# Patient Record
Sex: Male | Born: 1960 | Race: White | Hispanic: No | Marital: Married | Smoking: Current every day smoker
Health system: Southern US, Community
[De-identification: ages and names within clinical notes are randomized; demographics above are authoritative.]

## PROBLEM LIST (undated history)

## (undated) DIAGNOSIS — I2510 Atherosclerotic heart disease of native coronary artery without angina pectoris: Secondary | ICD-10-CM

## (undated) DIAGNOSIS — E785 Hyperlipidemia, unspecified: Secondary | ICD-10-CM

## (undated) DIAGNOSIS — I10 Essential (primary) hypertension: Secondary | ICD-10-CM

## (undated) DIAGNOSIS — Z720 Tobacco use: Secondary | ICD-10-CM

## (undated) DIAGNOSIS — G43909 Migraine, unspecified, not intractable, without status migrainosus: Secondary | ICD-10-CM

## (undated) HISTORY — DX: Atherosclerotic heart disease of native coronary artery without angina pectoris: I25.10

## (undated) HISTORY — PX: TONSILLECTOMY: SHX5217

## (undated) HISTORY — DX: Tobacco use: Z72.0

## (undated) HISTORY — DX: Migraine, unspecified, not intractable, without status migrainosus: G43.909

## (undated) HISTORY — DX: Essential (primary) hypertension: I10

## (undated) HISTORY — PX: DENTAL RESTORATION/EXTRACTION WITH X-RAY: SHX5796

## (undated) HISTORY — DX: Hyperlipidemia, unspecified: E78.5

## (undated) HISTORY — PX: WISDOM TOOTH EXTRACTION: SHX21

---

## 2012-12-20 HISTORY — PX: CORONARY ANGIOPLASTY WITH STENT PLACEMENT: SHX49

## 2012-12-25 ENCOUNTER — Ambulatory Visit: Payer: Self-pay | Admitting: Internal Medicine

## 2012-12-26 LAB — BASIC METABOLIC PANEL
Anion Gap: 12 (ref 7–16)
BUN: 11 mg/dL (ref 7–18)
Calcium, Total: 8.2 mg/dL — ABNORMAL LOW (ref 8.5–10.1)
Chloride: 108 mmol/L — ABNORMAL HIGH (ref 98–107)
Co2: 22 mmol/L (ref 21–32)
EGFR (African American): >60
EGFR (Non-African Amer.): >60
Glucose: 76 mg/dL (ref 65–99)
Potassium: 3.8 mmol/L (ref 3.5–5.1)

## 2012-12-26 LAB — CK-MB: CK-MB: <0.5 ng/mL — ABNORMAL LOW (ref 0.5–3.6)

## 2013-06-18 ENCOUNTER — Emergency Department: Payer: Self-pay | Admitting: Emergency Medicine

## 2013-06-18 LAB — CBC
HCT: 46.5 % (ref 40.0–52.0)
MCH: 33.1 pg (ref 26.0–34.0)
MCHC: 34.5 g/dL (ref 32.0–36.0)
MCV: 96 fL (ref 80–100)
RBC: 4.86 10*6/uL (ref 4.40–5.90)
RDW: 12.4 % (ref 11.5–14.5)

## 2013-06-18 LAB — URINALYSIS, COMPLETE
Bacteria: NONE SEEN
Bilirubin,UR: NEGATIVE
Blood: NEGATIVE
Ketone: NEGATIVE
Nitrite: NEGATIVE
Specific Gravity: 1.014 (ref 1.003–1.030)
WBC UR: NONE SEEN /HPF (ref 0–5)

## 2013-06-18 LAB — COMPREHENSIVE METABOLIC PANEL
Albumin: 4.1 g/dL (ref 3.4–5.0)
Alkaline Phosphatase: 79 U/L (ref 50–136)
Anion Gap: 5 — ABNORMAL LOW (ref 7–16)
BUN: 15 mg/dL (ref 7–18)
Calcium, Total: 9.1 mg/dL (ref 8.5–10.1)
Co2: 28 mmol/L (ref 21–32)
EGFR (Non-African Amer.): >60
Glucose: 96 mg/dL (ref 65–99)
Osmolality: 282 (ref 275–301)
Potassium: 4.2 mmol/L (ref 3.5–5.1)
SGOT(AST): 20 U/L (ref 15–37)
Sodium: 141 mmol/L (ref 136–145)

## 2015-04-11 NOTE — Discharge Summary (Signed)
PATIENT NAME:  Brad Cole, Brad Cole MR#:  [id number] DATE OF BIRTH:  06/22/1961  DATE OF ADMISSION:  12/25/2012 DATE OF DISCHARGE:  12/26/2012  PRIMARY CARE PHYSICIAN: Golden Pop, MD  DISCHARGE DIAGNOSES: 1. Unstable angina.  2. Hyperlipidemia and abnormal EKG.  3. Coronary artery disease.  HISTORY OF PRESENT ILLNESS AND HOSPITAL COURSE: This is a 54 year old male with heavy smoking and borderline hyperlipidemia who has had progressive episodes of chest discomfort and shortness of breath with an abnormal EKG showing T-wave inversions in the anterior precordium. The patient underwent cardiac catheterization showing moderate atherosclerosis of the circumflex and right coronary artery with a critical 100% stenosis of the left anterior descending artery. The patient underwent a PCI and drug-eluting stent placement of the left anterior descending artery without complication. The patient was ambulating well without further issue and had reached his maximal hospital benefit.   DISCHARGE MEDICATIONS: Plavix 75 mg each day, aspirin 325 mg each day, metoprolol 25 mg twice per day and Lipitor 40 mg each day.   FOLLOWUP: The patient is to follow up with Dr. Nehemiah Massed in two weeks for further adjustments of medications.     ____________________________ Corey Skains, MD bjk:es D: 12/26/2012 07:48:46 ET T: 12/26/2012 12:45:23 ET JOB#: [id number]  cc: Corey Skains, MD, <Dictator> Corey Skains MD ELECTRONICALLY SIGNED 01/04/2013 8:26

## 2015-07-09 DIAGNOSIS — E785 Hyperlipidemia, unspecified: Secondary | ICD-10-CM | POA: Insufficient documentation

## 2015-07-09 DIAGNOSIS — G43909 Migraine, unspecified, not intractable, without status migrainosus: Secondary | ICD-10-CM | POA: Insufficient documentation

## 2015-07-11 ENCOUNTER — Ambulatory Visit (INDEPENDENT_AMBULATORY_CARE_PROVIDER_SITE_OTHER): Payer: BLUE CROSS/BLUE SHIELD | Admitting: Family Medicine

## 2015-07-11 ENCOUNTER — Telehealth: Payer: Self-pay

## 2015-07-11 ENCOUNTER — Encounter: Payer: Self-pay | Admitting: Family Medicine

## 2015-07-11 VITALS — BP 153/98 | HR 85 | Temp 97.9°F | Ht 67.5 in | Wt 168.3 lb

## 2015-07-11 DIAGNOSIS — E785 Hyperlipidemia, unspecified: Secondary | ICD-10-CM

## 2015-07-11 DIAGNOSIS — R251 Tremor, unspecified: Secondary | ICD-10-CM

## 2015-07-11 DIAGNOSIS — I2510 Atherosclerotic heart disease of native coronary artery without angina pectoris: Secondary | ICD-10-CM

## 2015-07-11 DIAGNOSIS — Z720 Tobacco use: Secondary | ICD-10-CM | POA: Diagnosis not present

## 2015-07-11 DIAGNOSIS — R278 Other lack of coordination: Secondary | ICD-10-CM

## 2015-07-11 DIAGNOSIS — G43009 Migraine without aura, not intractable, without status migrainosus: Secondary | ICD-10-CM

## 2015-07-11 MED ORDER — PROPRANOLOL HCL 10 MG PO TABS: 10.0000 mg | ORAL_TABLET | Freq: Three times a day (TID) | ORAL | Status: DC

## 2015-07-11 NOTE — Assessment & Plan Note (Signed)
-   Patient is not ready to quit 

## 2015-07-11 NOTE — Patient Instructions (Addendum)
I've sent a prescription for propranolol 10 mg to [facility] and the new instructions are for one pill three times a day (every eight hours) You had been taking it just twice a day If you find that three times a day makes you sluggish or tired or run down, then decrease to twice a day and schedule a visit We'll contact you about the referral appointment to see the neurologist I do recommend that you quit smoking; if and when that day comes that you decide you are ready to quit, I am here to help if needed I do recommend that you don't drive if you have any trouble with shaking or coordination to the point that it interferes with your ability to operate machinery Return in 2 weeks

## 2015-07-11 NOTE — Assessment & Plan Note (Signed)
Refer to neurology with abnormal neurologic findings; order brain MRI; do NOT drive if any problems operating motor vehicle

## 2015-07-11 NOTE — Assessment & Plan Note (Addendum)
Sees Dr. Nehemiah Massed, but not checked in a while; check lipids today; patient not interested in statins; there is room for improvement in his diet, so encouraged no more than 3 eggs per week, and limit bacon, sausage, pepperoni, cheeseburgers, etc; more fruits and veggies and whole grains

## 2015-07-11 NOTE — Assessment & Plan Note (Signed)
Sees Dr. Nehemiah Massed

## 2015-07-11 NOTE — Addendum Note (Signed)
Addended by: Josilyn Shippee, Satira Anis on: 07/11/2015 12:15 PM   Modules accepted: Orders, SmartSet

## 2015-07-11 NOTE — Assessment & Plan Note (Signed)
Much improved with beta-blocker

## 2015-07-11 NOTE — Telephone Encounter (Signed)
Called to notify patient that he is scheduled for the following: MRI brain w/o contrast [facility] 07/15/15 Arrive at 2:30 for a 3:00 appointment.

## 2015-07-11 NOTE — Progress Notes (Signed)
BP 153/98 mmHg  Pulse 85  Temp(Src) 97.9 F (36.6 C)  Ht 5' 7.5" (1.715 m)  Wt 168 lb 4.8 oz (76.34 kg)  BMI 25.96 kg/m2  SpO2 99%   Subjective:    Patient ID: Brad Cole, male    DOB: Jan 22, 1961, 54 y.o.   MRN: [id number]  HPI: Brad Cole is a 54 y.o. male  Chief Complaint  Patient presents with  . Hypertension    pt is out of his propanolol, he now has to to have this in a 90 day supply through [facility].  . Tremors    patient complains of tremors for several months, states that it has gotten progressively worse. He does not notice it his family members notice it.    He needs refills of beta-blocker, took last one yesterday; helps limit headaches tremendously He has been having twitches and tremors; started several months ago; every one else noticing; happens behind the pulpit; sometimes when driving, it occurs; doesn't do it all the time; laying on the couch, can happen then too; started in the right hand several month ago; his father has a nervous shake, not sure how old; he is still alive, 52 years old Does not feel nervous around people or nervous on the inside No change in his voice, no shakiness in his voice He does stutter sometimes, not bad; that has been going on for a while He has lost 2-3 pounds but that's just summertime loss he says He usually does not sleep very well; 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 Interim medical history since our last visit reviewed. Allergies and medications reviewed and updated.  Review of Systems  Per HPI unless specifically indicated above      Objective:    BP 153/98 mmHg  Pulse 85  Temp(Src) 97.9 F (36.6 C)  Ht 5' 7.5" (1.715 m)  Wt 168 lb 4.8 oz (76.34 kg)  BMI 25.96 kg/m2  SpO2 99%  Wt Readings from Last 3 Encounters:  07/11/15 168 lb 4.8 oz (76.34 kg)  02/06/15 172 lb (78.019 kg)    Physical Exam  Neurological: He is alert. He displays tremor (significant tremor, RUE most noticeable, no cogwheeling). He displays no atrophy. No cranial nerve deficit or sensory deficit. He exhibits abnormal muscle tone. He displays no seizure activity. Coordination and gait abnormal.  Reflex Scores:      Patellar reflexes are 4+ on the right side and 3+ on the left side. UE strength decreased, thumb index finger pincher 3/5, grip 4-/5 B, biceps 4/5 B, no muscle fasciculations; no atrophy; finger-to-nose testing and finger-to-nose-to-finger testing shows asymmetry and dyscoordination R>>L; cross-adductor response on the R with patellar tendon reflex testing; no cogwheeling  Psychiatric: He has a normal mood and affect. His speech is normal. Judgment and thought content normal. Cognition and memory are normal.   Results for orders placed or performed in visit on 06/18/13  Urinalysis, Complete  Result Value Ref Range   Color - urine Yellow    Clarity - urine Clear    Glucose,UR Negative 0-75 mg/dL   Bilirubin,UR Negative NEGATIVE  Ketone Negative NEGATIVE   Specific Gravity 1.014 1.003-1.030   Blood Negative NEGATIVE   Ph 5.0 4.5-8.0   Protein Negative NEGATIVE   Nitrite Negative NEGATIVE   Leukocyte Esterase Negative NEGATIVE   RBC,UR <1 /HPF 0-5 /HPF   WBC UR NONE SEEN 0-5 /HPF   Bacteria NONE SEEN NONE SEEN   Squamous Epithelial <1 /HPF    Mucous PRESENT   CBC  Result Value Ref Range   WBC 9.2 3.8-10.6 x10 3/mm 3   RBC 4.86 4.40-5.90 x10 6/mm 3   HGB 16.1 13.0-18.0 g/dL   HCT 46.5 40.0-52.0 %   MCV 96 80-100 fL   MCH 33.1 26.0-34.0 pg   MCHC 34.5 32.0-36.0 g/dL   RDW 12.4 11.5-14.5 %   Platelet 245 150-440 x10 3/mm 3   Comprehensive metabolic panel  Result Value Ref Range   Glucose 96 65-99 mg/dL   BUN 15 7-18 mg/dL   Creatinine 0.99 0.60-1.30 mg/dL   Sodium 141 136-145 mmol/L   Potassium 4.2 3.5-5.1 mmol/L   Chloride 108 (H) 98-107 mmol/L   Co2 28 21-32 mmol/L   Calcium, Total 9.1 8.5-10.1 mg/dL   SGOT(AST) 20 15-37 Unit/L   SGPT (ALT) 28 12-78 U/L   Alkaline Phosphatase 79 50-136 Unit/L   Albumin 4.1 3.4-5.0 g/dL   Total Protein 7.5 6.4-8.2 g/dL   Bilirubin,Total 0.4 0.2-1.0 mg/dL   Osmolality 282 275-301   Anion Gap 5 (L) 7-16   EGFR (African American) >60    EGFR (Non-African Amer.) >60       Assessment & Plan:   Problem List Items Addressed This Visit      Cardiovascular and Mediastinum   Migraine    Much improved with beta-blocker      Relevant Medications   propranolol (INDERAL) 10 MG tablet   CAD (coronary artery disease)    Sees Dr. Nehemiah Massed      Relevant Medications   propranolol (INDERAL) 10 MG tablet   Other Relevant Orders   CBC with Differential/Platelet   Comprehensive metabolic panel     Other   Hyperlipidemia    Sees Dr. Nehemiah Massed, but not checked in a while; check lipids today; patient not interested in statins; there is room for improvement in his diet, so encouraged no more than 3 eggs per week, and limit bacon, sausage, pepperoni, cheeseburgers, etc; more fruits and veggies and whole grains      Relevant Medications   propranolol (INDERAL) 10 MG tablet   Other Relevant Orders   Lipid Panel w/o Chol/HDL Ratio   Tobacco abuse    Patient is not ready to quit      Tremor - Primary    Refer to neurology with abnormal neurologic findings; order brain MRI; do NOT drive if any problems operating motor vehicle      Relevant Orders   TSH   Ambulatory referral to Neurology       Follow up plan: Return in about 2 weeks (around 07/25/2015) for BP, tremors.

## 2015-07-12 LAB — CBC WITH DIFFERENTIAL/PLATELET
BASOS: 1 %
Basophils Absolute: 0 10*3/uL (ref 0.0–0.2)
EOS (ABSOLUTE): 0.1 10*3/uL (ref 0.0–0.4)
EOS: 2 %
HEMATOCRIT: 49.1 % (ref 37.5–51.0)
Hemoglobin: 16.9 g/dL (ref 12.6–17.7)
Immature Grans (Abs): 0 10*3/uL (ref 0.0–0.1)
Immature Granulocytes: 0 %
Lymphocytes Absolute: 3 10*3/uL (ref 0.7–3.1)
Lymphs: 36 %
MCH: 33.9 pg — ABNORMAL HIGH (ref 26.6–33.0)
MCHC: 34.4 g/dL (ref 31.5–35.7)
MCV: 99 fL — ABNORMAL HIGH (ref 79–97)
MONOCYTES: 6 %
Monocytes Absolute: 0.5 10*3/uL (ref 0.1–0.9)
NEUTROS ABS: 4.6 10*3/uL (ref 1.4–7.0)
NEUTROS PCT: 55 %
Platelets: 221 10*3/uL (ref 150–379)
RBC: 4.98 x10E6/uL (ref 4.14–5.80)
RDW: 12.9 % (ref 12.3–15.4)
WBC: 8.4 10*3/uL (ref 3.4–10.8)

## 2015-07-12 LAB — COMPREHENSIVE METABOLIC PANEL
A/G RATIO: 2 (ref 1.1–2.5)
ALK PHOS: 80 IU/L (ref 39–117)
ALT: 24 IU/L (ref 0–44)
AST: 15 IU/L (ref 0–40)
Albumin: 4.4 g/dL (ref 3.5–5.5)
BUN/Creatinine Ratio: 12 (ref 9–20)
BUN: 12 mg/dL (ref 6–24)
Bilirubin Total: 0.2 mg/dL (ref 0.0–1.2)
CO2: 18 mmol/L (ref 18–29)
CREATININE: 1.01 mg/dL (ref 0.76–1.27)
Calcium: 9.5 mg/dL (ref 8.7–10.2)
Chloride: 100 mmol/L (ref 97–108)
GFR calc Af Amer: 99 mL/min/{1.73_m2} (ref >59)
GFR, EST NON AFRICAN AMERICAN: 86 mL/min/{1.73_m2} (ref >59)
Globulin, Total: 2.2 g/dL (ref 1.5–4.5)
Glucose: 101 mg/dL — ABNORMAL HIGH (ref 65–99)
Potassium: 4.1 mmol/L (ref 3.5–5.2)
Sodium: 138 mmol/L (ref 134–144)
Total Protein: 6.6 g/dL (ref 6.0–8.5)

## 2015-07-12 LAB — LIPID PANEL W/O CHOL/HDL RATIO
Cholesterol, Total: 257 mg/dL — ABNORMAL HIGH (ref 100–199)
HDL: 35 mg/dL — ABNORMAL LOW (ref >39)
LDL Calculated: 199 mg/dL — ABNORMAL HIGH (ref 0–99)
Triglycerides: 117 mg/dL (ref 0–149)
VLDL CHOLESTEROL CAL: 23 mg/dL (ref 5–40)

## 2015-07-12 LAB — TSH: TSH: 1.85 u[IU]/mL (ref 0.450–4.500)

## 2015-07-14 ENCOUNTER — Encounter: Payer: Self-pay | Admitting: Family Medicine

## 2015-07-15 ENCOUNTER — Ambulatory Visit: Payer: BLUE CROSS/BLUE SHIELD

## 2015-07-16 ENCOUNTER — Telehealth: Payer: Self-pay | Admitting: Family Medicine

## 2015-07-16 NOTE — Telephone Encounter (Signed)
I left msg, asked pt to please call regarding study and referral that I have ordered

## 2015-07-17 NOTE — Telephone Encounter (Signed)
I left detailed message It is my professional opinion that he see the neurologist and have the scan done I cannot really help him if he won't follow through and see the specialist and have the study done I am here to help I hope he will change his mind; he is welcome to call back and let my staff know to proceed with testing but I'm not sure what else I can do for him if he won't work with my plan

## 2015-07-28 ENCOUNTER — Ambulatory Visit: Payer: BLUE CROSS/BLUE SHIELD | Admitting: Family Medicine

## 2015-08-01 ENCOUNTER — Encounter: Payer: Self-pay | Admitting: Family Medicine

## 2015-08-01 ENCOUNTER — Ambulatory Visit (INDEPENDENT_AMBULATORY_CARE_PROVIDER_SITE_OTHER): Payer: BLUE CROSS/BLUE SHIELD | Admitting: Neurology

## 2015-08-01 ENCOUNTER — Encounter: Payer: Self-pay | Admitting: Neurology

## 2015-08-01 VITALS — BP 138/90 | HR 76 | Ht 67.0 in | Wt 171.0 lb

## 2015-08-01 DIAGNOSIS — Z720 Tobacco use: Secondary | ICD-10-CM | POA: Diagnosis not present

## 2015-08-01 DIAGNOSIS — I10 Essential (primary) hypertension: Secondary | ICD-10-CM | POA: Diagnosis not present

## 2015-08-01 DIAGNOSIS — R479 Unspecified speech disturbances: Secondary | ICD-10-CM

## 2015-08-01 DIAGNOSIS — R251 Tremor, unspecified: Secondary | ICD-10-CM | POA: Diagnosis not present

## 2015-08-01 DIAGNOSIS — R531 Weakness: Secondary | ICD-10-CM | POA: Diagnosis not present

## 2015-08-01 NOTE — Patient Instructions (Signed)
If you decide you want to proceed with recommended testing, let us know.

## 2015-08-01 NOTE — Progress Notes (Signed)
I spoke with movement disorder specialist His tremor went away on distraction; stuttering speech went away during visit He is perplexing, tremors went away with ambulation, uncharacteristic for Parkinson's May be psychogenic, related to stress, he declined counseling when she addressed that with him She would like a DAT scan to look at dopamine but [employer] won't pay He is refusing MRI Weakness is perhaps give-way weakness, he is refuses testing on that too She would like at least like serial exams May be psychogenic, stress component but he won't allow other testing It may take several visits to parse this out I thanked her for seeing him

## 2015-08-01 NOTE — Progress Notes (Signed)
Brad Cole was seen today in the movement disorders clinic for neurologic consultation at the request of Enid Derry, MD.  The consultation is for the evaluation of tremor and hyperreflexia.  The records that were made available to me were reviewed.  Pt began to notice tremor in the right hand (dominant hand) several months ago.  Pt states that increased over time "but I think that it is a nervous habit."  He admits that stress can increase it.  He states that the tremor won't happen at all when he uses the hands.  At night, he will note that the legs tremor."  He has noted tremor in the L thumb will tremor.  His brother has a "hand shake."    Specific Symptoms:  Tremor: Yes.   Voice: no change Sleep: trouble going to sleep  Vivid Dreams:  No.  Acting out dreams:  No. Wet Pillows: No. Postural symptoms:  No.  Falls?  No. Bradykinesia symptoms: slow movements Loss of smell:  Yes.   ("I've never had a good sense of smell") Loss of taste:  Yes.   Urinary Incontinence:  No. Difficulty Swallowing:  No. Handwriting, micrographia: Yes.   ("a little smaller") Trouble with ADL's:  No.  Trouble buttoning clothing: No. Depression:  No. Memory changes:  Yes.   ("not as good as it used to be") Hallucinations:  No.  visual distortions: No. N/V:  No. Lightheaded:  Yes.   (rarely)  Syncope: No. Diplopia:  No. Dyskinesia:  No.  Neuroimaging has not previously been performed.  His PCP ordered an MRI but he cancelled that.  ALLERGIES:  No Known Allergies  CURRENT MEDICATIONS:  Outpatient Encounter Prescriptions as of 08/01/2015  Medication Sig  . aspirin EC 81 MG tablet Take 81 mg by mouth daily.  . Multiple Vitamin (MULTIVITAMIN) tablet Take 1 tablet by mouth daily.  . propranolol (INDERAL) 10 MG tablet Take 1 tablet (10 mg total) by mouth 3 (three) times daily. (Patient taking differently: Take 10 mg by mouth daily. )   No facility-administered encounter medications on file  as of 08/01/2015.    PAST MEDICAL HISTORY:   Past Medical History  Diagnosis Date  . Migraine   . Hyperlipidemia   . Tobacco abuse   . CAD (coronary artery disease)   . Hypertension     PAST SURGICAL HISTORY:   Past Surgical History  Procedure Laterality Date  . Wisdom tooth extraction    . Tonsillectomy    . Coronary angioplasty with stent placement  2014    Dr. Nehemiah Massed  . Dental restoration/extraction with x-ray      full upper dental extraction    SOCIAL HISTORY:   Social History   Social History  . Marital Status: Married    Spouse Name: N/A  . Number of Children: N/A  . Years of Education: N/A   Occupational History  . Not on file.   Social History Main Topics  . Smoking status: Current Every Day Smoker -- 1.00 packs/day    Types: Cigarettes  . Smokeless tobacco: Never Used  . Alcohol Use: No  . Drug Use: No  . Sexual Activity: Yes   Other Topics Concern  . Not on file   Social History Narrative    FAMILY HISTORY:   Family Status  Relation Status Death Age  . Father Alive     heart disease, pancreatic cancer  . Sister Alive     cerebral palsy, seizures  . Mother  Alive     healthy  . Brother Alive     healthy  . Son Alive     healthy  . Maternal Grandmother Deceased   . Maternal Grandfather Deceased   . Paternal Grandmother Deceased   . Paternal Grandfather Deceased     ROS:  A complete 10 system review of systems was obtained and was unremarkable apart from what is mentioned above.  PHYSICAL EXAMINATION:    VITALS:   Filed Vitals:   08/01/15 0757  BP: 138/90  Pulse: 76  Height: 5\' 7"  (1.702 m)  Weight: 171 lb (77.565 kg)   Pt undressed and pt into examining shorts prior to the examination  GEN:  The patient appears stated age and is in NAD. HEENT:  Normocephalic, atraumatic.  The mucous membranes are moist. The superficial temporal arteries are without ropiness or tenderness. CV:  RRR Lungs:  CTAB Neck/HEME:  There are no  carotid bruits bilaterally.  Neurological examination:  Orientation: The patient is alert and oriented x3. Fund of knowledge is appropriate.  Recent and remote memory are intact.  Attention and concentration are normal.    Able to name objects and repeat phrases. Cranial nerves: There is good facial symmetry. Pupils are equal round and reactive to light bilaterally. Fundoscopic exam reveals clear margins bilaterally. Extraocular muscles are intact.  There are no square wave jerks.  The visual fields are full to confrontational testing. The speech is initially stuttering in quality but as the appointment goes on that goes away; speech is clear.  He has no difficulty with the guttural sounds.  Soft palate rises symmetrically and there is no tongue deviation. Hearing is intact to conversational tone. Sensation: Sensation is intact to light and pinprick throughout (facial, trunk, extremities). Vibration is slightly decreased at the bilateral big toe. There is no extinction with double simultaneous stimulation. There is no sensory dermatomal level identified. Motor: The patient has marked decreased grip strength bilaterally (3/5 bilaterally).  Strength in the upper extremities is at least 4/5 bilaterally but it does improve slightly with encouragement.  Strength in the lower extremities is 5-/5 and again improves slightly with encouragement.  There is give way weakness in the upper and lower extremities, but especially in the lower extremities. Deep tendon reflexes: Deep tendon reflexes are 2/4 at the bilateral biceps, triceps, brachioradialis, patella and achilles.  No cross adductor reflexes.  No pectoralis or jaw jerk reflex. Plantar responses are downgoing bilaterally.  Movement examination: Tone: There is normal tone in the bilateral upper extremities.  The tone in the lower extremities is normal.  Abnormal movements: there is tremor (constant) in the bilateral lower extremities and in the R>L UE  throughout the first 20 minutes of the history taking, but it then went away with distraction procedures and was gone nearly for the next 40 minutes.  Tremor did not reappear when he ambulated down the hall.  When asked to name the alphabet backwards or to name the months of the year backward, tremor was not present in the arms or legs. Coordination:  There is slowness of movement with virtually all forms of rapid alternating movements, but not necessarily decremation.   Gait and Station: The patient has no difficulty arising out of a deep-seated chair without the use of the hands. The patient's stride length is normal, but he does have decreased arm swing on the right and drags the right leg.  He is able to ambulate in a tandem fashion.  He is able  to heel toe walk.  He is able to stand in the Romberg position.  LABS  Lab Results  Component Value Date   TSH 1.850 07/11/2015       Chemistry      Component Value Date/Time   NA 138 07/11/2015 1134   NA 141 06/18/2013 1107   K 4.1 07/11/2015 1134   K 4.2 06/18/2013 1107   CL 100 07/11/2015 1134   CL 108* 06/18/2013 1107   CO2 18 07/11/2015 1134   CO2 28 06/18/2013 1107   BUN 12 07/11/2015 1134   BUN 15 06/18/2013 1107   CREATININE 1.01 07/11/2015 1134   CREATININE 0.99 06/18/2013 1107      Component Value Date/Time   CALCIUM 9.5 07/11/2015 1134   CALCIUM 9.1 06/18/2013 1107   ALKPHOS 80 07/11/2015 1134   ALKPHOS 79 06/18/2013 1107   AST 15 07/11/2015 1134   AST 20 06/18/2013 1107   ALT 24 07/11/2015 1134   ALT 28 06/18/2013 1107   BILITOT 0.2 07/11/2015 1134   BILITOT 0.4 06/18/2013 1107     No results found for: VITAMINB12   ASSESSMENT/PLAN:  1.  Tremor and weakness  -I agree with his primary care physician that an MRI of the brain would be indicated.  Despite a long conversation with the patient, he has refused this test.  He also does not want an MRI of the cervical spine.  He repeatedly states "I feel fine."  -I think  an EMG could be of value (because things like PD don't cause weakness of intrinsic mm of hands).  He does not want to proceed.  -Because there definitely are some nonphysiologic aspects to his examination (intermittent stuttering, give way weakness in face of significant tremor), I think a DaT scan could help to sort things out.  Unfortunately, his insurance company does not pay for this.  I talked to him about this and since it is an expensive test, this is not something that we can proceed with.  I did tell him that time eventually will likely sort this out on its own.  However, I did offer him consultation at another movement disorder center, but he wants to hold on that.  I do think that stress plays a role here (the question is whether it plays the primary role) and we discussed that.  The patient does not necessarily disagree and I encouraged counseling just to address this part.  He does not wish to do that. 2. HTN  -He is on propranolol, which his primary physician just increased to 3 times a day.  He admits that he has not done that.  He admits that he is rather noncompliant, better with medications as well as with diet and exercise.  We talked about proper diet and exercise 3.  Tobacco abuse  -We talked about the importance of tobacco cessation for overall health and wellness. 4.  Much greater than 50% of this visit was spent in counseling with the patient and coordinating care.  I communicated to Dr. Sanda Klein the above over the phone as well  Total face to face time:  60 min

## 2015-10-09 ENCOUNTER — Ambulatory Visit (INDEPENDENT_AMBULATORY_CARE_PROVIDER_SITE_OTHER): Payer: BLUE CROSS/BLUE SHIELD | Admitting: Family Medicine

## 2015-10-09 ENCOUNTER — Encounter: Payer: Self-pay | Admitting: Family Medicine

## 2015-10-09 VITALS — BP 157/93 | HR 65 | Temp 98.6°F | Ht 66.0 in | Wt 171.0 lb

## 2015-10-09 DIAGNOSIS — Z87891 Personal history of nicotine dependence: Secondary | ICD-10-CM

## 2015-10-09 DIAGNOSIS — I10 Essential (primary) hypertension: Secondary | ICD-10-CM | POA: Diagnosis not present

## 2015-10-09 DIAGNOSIS — E785 Hyperlipidemia, unspecified: Secondary | ICD-10-CM

## 2015-10-09 DIAGNOSIS — I2510 Atherosclerotic heart disease of native coronary artery without angina pectoris: Secondary | ICD-10-CM

## 2015-10-09 DIAGNOSIS — R21 Rash and other nonspecific skin eruption: Secondary | ICD-10-CM | POA: Diagnosis not present

## 2015-10-09 DIAGNOSIS — R251 Tremor, unspecified: Secondary | ICD-10-CM | POA: Diagnosis not present

## 2015-10-09 MED ORDER — TRIAMCINOLONE ACETONIDE 0.5 % EX CREA: 1.0000 "application " | TOPICAL_CREAM | Freq: Three times a day (TID) | CUTANEOUS | Status: DC

## 2015-10-09 MED ORDER — HYDROXYZINE PAMOATE 25 MG PO CAPS: 25.0000 mg | ORAL_CAPSULE | Freq: Four times a day (QID) | ORAL | Status: DC | PRN

## 2015-10-09 MED ORDER — AMLODIPINE BESYLATE 5 MG PO TABS: 5.0000 mg | ORAL_TABLET | Freq: Every day | ORAL | Status: DC

## 2015-10-09 NOTE — Progress Notes (Signed)
BP 157/93 mmHg  Pulse 65  Temp(Src) 98.6 F (37 C)  Ht 5\' 6"  (1.676 m)  Wt 171 lb (77.565 kg)  BMI 27.61 kg/m2  SpO2 98%   Subjective:    Patient ID: Brad Cole, male    DOB: 1961-06-23, 54 y.o.   MRN: [id number]  HPI: Brad Cole is a 54 y.o. male  Chief Complaint  Patient presents with  . Rash    On hands. Doesn't itch. More burns.    Patient is here complaining of a rash on his hands; woke up with rash on Wednesday morning; red splotches and itty bitty bumps on both hands; just on the palms; feet are fine; he used some cortisone cream, not much of a change; can feel the tingling in the fingertips, pricking feeling almost; fingers don't feel swollen; treating wart on middle finger right hand; treating with compound W No pets; did rub a cat with left hand Tuesday night, but other people not itching Lips and tongue don't feel swollen  High blood pressure; if he takes three pills a day, it saps his energy; usually takes one pill every morning; he has not been on other blood rpessure medicines; he does not add salt at the table; not paying attention to sodium on labels; no high BP in the family; since he quit smoking two months ago, cut back on his coffee; half a cup of coffee now daily  He has high cholesterol; he has not seen his cardiologist in a while; he is not interested in taking cholesterol medicine (statin); has not tried zetia  He has been using vapor (electronic cigarettes) to knock off the edge of nicotine craving as he quits smoking  Relevant past medical, surgical, family and social history reviewed and updated as indicated. Interim medical history since our last visit reviewed. Allergies and medications reviewed and updated.  Review of Systems Per HPI unless specifically indicated above     Objective:    BP 157/93 mmHg  Pulse 65  Temp(Src) 98.6 F (37 C)  Ht 5\' 6"  (1.676 m)  Wt 171 lb (77.565 kg)  BMI 27.61 kg/m2  SpO2 98%  Wt Readings  from Last 3 Encounters:  10/09/15 171 lb (77.565 kg)  08/01/15 171 lb (77.565 kg)  07/11/15 168 lb 4.8 oz (76.34 kg)    Physical Exam  Constitutional: He appears well-developed and well-nourished. No distress.  HENT:  No swelling of lips or tongue  Eyes: No scleral icterus.  Neck: No JVD present.  Cardiovascular: Normal rate.   Pulmonary/Chest: Effort normal. No respiratory distress.  Abdominal: He exhibits no distension.  Musculoskeletal: He exhibits no edema.  Neurological: He is alert. He displays tremor (intermittent, seems to wax and wane during visit).  Skin: Rash (erythematous mild papular rash on the hands) noted.  Psychiatric: He has a normal mood and affect. His affect is not inappropriate.   Results for orders placed or performed in visit on 07/11/15  CBC with Differential/Platelet  Result Value Ref Range   WBC 8.4 3.4 - 10.8 x10E3/uL   RBC 4.98 4.14 - 5.80 x10E6/uL   Hemoglobin 16.9 12.6 - 17.7 g/dL   Hematocrit 49.1 37.5 - 51.0 %   MCV 99 (H) 79 - 97 fL   MCH 33.9 (H) 26.6 - 33.0 pg   MCHC 34.4 31.5 - 35.7 g/dL   RDW 12.9 12.3 - 15.4 %   Platelets 221 150 - 379 x10E3/uL   Neutrophils 55 %   Lymphs 36 %  Monocytes 6 %   Eos 2 %   Basos 1 %   Neutrophils Absolute 4.6 1.4 - 7.0 x10E3/uL   Lymphocytes Absolute 3.0 0.7 - 3.1 x10E3/uL   Monocytes Absolute 0.5 0.1 - 0.9 x10E3/uL   EOS (ABSOLUTE) 0.1 0.0 - 0.4 x10E3/uL   Basophils Absolute 0.0 0.0 - 0.2 x10E3/uL   Immature Granulocytes 0 %   Immature Grans (Abs) 0.0 0.0 - 0.1 x10E3/uL  Comprehensive metabolic panel  Result Value Ref Range   Glucose 101 (H) 65 - 99 mg/dL   BUN 12 6 - 24 mg/dL   Creatinine, Ser 1.01 0.76 - 1.27 mg/dL   GFR calc non Af Amer 86 >59 mL/min/1.73   GFR calc Af Amer 99 >59 mL/min/1.73   BUN/Creatinine Ratio 12 9 - 20   Sodium 138 134 - 144 mmol/L   Potassium 4.1 3.5 - 5.2 mmol/L   Chloride 100 97 - 108 mmol/L   CO2 18 18 - 29 mmol/L   Calcium 9.5 8.7 - 10.2 mg/dL   Total Protein  6.6 6.0 - 8.5 g/dL   Albumin 4.4 3.5 - 5.5 g/dL   Globulin, Total 2.2 1.5 - 4.5 g/dL   Albumin/Globulin Ratio 2.0 1.1 - 2.5   Bilirubin Total 0.2 0.0 - 1.2 mg/dL   Alkaline Phosphatase 80 39 - 117 IU/L   AST 15 0 - 40 IU/L   ALT 24 0 - 44 IU/L  TSH  Result Value Ref Range   TSH 1.850 0.450 - 4.500 uIU/mL  Lipid Panel w/o Chol/HDL Ratio  Result Value Ref Range   Cholesterol, Total 257 (H) 100 - 199 mg/dL   Triglycerides 117 0 - 149 mg/dL   HDL 35 (L) >39 mg/dL   VLDL Cholesterol Cal 23 5 - 40 mg/dL   LDL Calculated 199 (H) 0 - 99 mg/dL      Assessment & Plan:   Problem List Items Addressed This Visit      Cardiovascular and Mediastinum   CAD (coronary artery disease)    He has not seen his cardiologist for a while; has stent; has tried statins and did not tolerated; willing to try zetia; recheck cholesterol in 3 months; continue daily aspirin and beta-blocker      Relevant Medications   amLODipine (NORVASC) 5 MG tablet   ezetimibe (ZETIA) 10 MG tablet   Essential hypertension, benign    Uncontrolled today; try to follow the DASH guidelines; continue propranolol PRN, add CCB and f/u in 3 weeks      Relevant Medications   amLODipine (NORVASC) 5 MG tablet   ezetimibe (ZETIA) 10 MG tablet     Musculoskeletal and Integument   Rash of hands - Primary    New problem; feet not involved; not typical for dishydrotic eczema; if not improving with oral anti-histamine and topical cream, consider further testing        Other   Hyperlipidemia    Uncontrolled, with high LDL; he is willing to try zetia; he did not tolerate statins; if zetia does not bring LDL down, consider newer PCSK9 inhibitor      Relevant Medications   amLODipine (NORVASC) 5 MG tablet   ezetimibe (ZETIA) 10 MG tablet   Tobacco abuse    Resolved, now history of regular cigarettes, though still using nicotine; cautioned of vapor flavorings and risk of bronchiolitis obliterans      Tremor    Noted to wax and  wane during visit; he has seen neurologist  Follow up plan: Return in about 3 weeks (around 10/30/2015) for high blood pressure.  An after-visit summary was printed and given to the patient at [place].  Please see the patient instructions which may contain other information and recommendations beyond what is mentioned above in the assessment and plan.  Meds ordered this encounter  Medications  . amLODipine (NORVASC) 5 MG tablet    Sig: Take 1 tablet (5 mg total) by mouth daily.    Dispense:  30 tablet    Refill:  0  . triamcinolone cream (KENALOG) 0.5 %    Sig: Apply 1 application topically 3 (three) times daily. On hands    Dispense:  30 g    Refill:  0  . hydrOXYzine (VISTARIL) 25 MG capsule    Sig: Take 1 capsule (25 mg total) by mouth every 6 (six) hours as needed. For itching; do not drive or operate machinery    Dispense:  15 capsule    Refill:  0  . ezetimibe (ZETIA) 10 MG tablet    Sig: Take 1 tablet (10 mg total) by mouth daily.    Dispense:  30 tablet    Refill:  5

## 2015-10-09 NOTE — Assessment & Plan Note (Addendum)
He has not seen his cardiologist for a while; has stent; has tried statins and did not tolerated; willing to try zetia; recheck cholesterol in 3 months; continue daily aspirin and beta-blocker

## 2015-10-09 NOTE — Assessment & Plan Note (Addendum)
Uncontrolled today; try to follow the DASH guidelines; continue propranolol PRN, add CCB and f/u in 3 weeks

## 2015-10-09 NOTE — Assessment & Plan Note (Addendum)
Resolved, now history of regular cigarettes, though still using nicotine; cautioned of vapor flavorings and risk of bronchiolitis obliterans

## 2015-10-09 NOTE — Patient Instructions (Addendum)
Use the new cream on the hands twice a day Use the pill for itching if needed, but it might make you sleepy so only use at night or if home and not driving Start the new cholesterol pill Start the new blood pressure pill Try to limit saturated fats in your diet (bologna, hot dogs, barbeque, cheeseburgers, hamburgers, steak, bacon, sausage, cheese, etc.) and get more fresh fruits, vegetables, and whole grains  Try the DASH guidelines  Avoid flavored vapor in electronic  From the American Lung Association: Over a decade ago, workers in a [employer] were sickened by breathing in [employer] in foods like popcorn, caramel and dairy products. While this flavoring may be tasty, it was linked to deaths and hundreds of cases of bronchiolitis obliterans, a serious and irreversible lung disease. As a result, the major popcorn manufacturers removed diacetyl from their products, but some people are still being exposed to diacetyl - not through food flavorings as a worksite hazard, but through e-cigarette vapor. When inhaled, diacetyl causes bronchiolitis obliterans - more commonly referred to as "popcorn lung" - a scarring of the tiny air sacs in the lungs resulting in the thickening and narrowing of the airways. While the name "popcorn lung" may not sound like a threat, it's a serious lung disease that causes coughing, wheezing and shortness of breath, similar to the symptoms of chronic obstructive pulmonary disease (COPD).    DASH Eating Plan DASH stands for "Dietary Approaches to Stop Hypertension." The DASH eating plan is a healthy eating plan that has been shown to reduce high blood pressure (hypertension). Additional health benefits may include reducing the risk of type 2 diabetes mellitus, heart disease, and stroke. The DASH eating plan may also help with weight loss. WHAT DO I NEED TO KNOW ABOUT THE DASH EATING PLAN? For the DASH eating plan, you will follow  these general guidelines:  Choose foods with a percent daily value for sodium of less than 5% (as listed on the food label).  Use salt-free seasonings or herbs instead of table salt or sea salt.  Check with your health care provider or pharmacist before using salt substitutes.  Eat lower-sodium products, often labeled as "lower sodium" or "no salt added."  Eat fresh foods.  Eat more vegetables, fruits, and low-fat dairy products.  Choose whole grains. Look for the word "whole" as the first word in the ingredient list.  Choose fish and skinless chicken or [place] more often than red meat. Limit fish, poultry, and meat to 6 oz (170 g) each day.  Limit sweets, desserts, sugars, and sugary drinks.  Choose heart-healthy fats.  Limit cheese to 1 oz (28 g) per day.  Eat more home-cooked food and less restaurant, buffet, and fast food.  Limit fried foods.  Cook foods using methods other than frying.  Limit canned vegetables. If you do use them, rinse them well to decrease the sodium.  When eating at a restaurant, ask that your food be prepared with less salt, or no salt if possible. WHAT FOODS CAN I EAT? Seek help from a dietitian for individual calorie needs. Grains Whole grain or whole wheat bread. Brown rice. Whole grain or whole wheat pasta. Quinoa, bulgur, and whole grain cereals. Low-sodium cereals. Corn or whole wheat flour tortillas. Whole grain cornbread. Whole grain crackers. Low-sodium crackers. Vegetables Fresh or frozen vegetables (raw, steamed, roasted, or grilled). Low-sodium or reduced-sodium tomato and vegetable juices. Low-sodium or reduced-sodium tomato sauce and paste. Low-sodium or reduced-sodium canned  vegetables.  Fruits All fresh, canned (in natural juice), or frozen fruits. Meat and Other Protein Products Ground beef (85% or leaner), grass-fed beef, or beef trimmed of fat. Skinless chicken or [place]. Ground chicken or [place]. Pork trimmed of fat. All fish and  seafood. Eggs. Dried beans, peas, or lentils. Unsalted nuts and seeds. Unsalted canned beans. Dairy Low-fat dairy products, such as skim or 1% milk, 2% or reduced-fat cheeses, low-fat ricotta or cottage cheese, or plain low-fat yogurt. Low-sodium or reduced-sodium cheeses. Fats and Oils Tub margarines without trans fats. Light or reduced-fat mayonnaise and salad dressings (reduced sodium). Avocado. Safflower, olive, or canola oils. Natural peanut or almond butter. Other Unsalted popcorn and pretzels. The items listed above may not be a complete list of recommended foods or beverages. Contact your dietitian for more options. WHAT FOODS ARE NOT RECOMMENDED? Grains White bread. White pasta. White rice. Refined cornbread. Bagels and croissants. Crackers that contain trans fat. Vegetables Creamed or fried vegetables. Vegetables in a cheese sauce. Regular canned vegetables. Regular canned tomato sauce and paste. Regular tomato and vegetable juices. Fruits Dried fruits. Canned fruit in light or heavy syrup. Fruit juice. Meat and Other Protein Products Fatty cuts of meat. Ribs, chicken wings, bacon, sausage, bologna, salami, chitterlings, fatback, hot dogs, bratwurst, and packaged luncheon meats. Salted nuts and seeds. Canned beans with salt. Dairy Whole or 2% milk, cream, half-and-half, and cream cheese. Whole-fat or sweetened yogurt. Full-fat cheeses or blue cheese. Nondairy creamers and whipped toppings. Processed cheese, cheese spreads, or cheese curds. Condiments Onion and garlic salt, seasoned salt, table salt, and sea salt. Canned and packaged gravies. Worcestershire sauce. Tartar sauce. Barbecue sauce. Teriyaki sauce. Soy sauce, including reduced sodium. Steak sauce. Fish sauce. Oyster sauce. Cocktail sauce. Horseradish. Ketchup and mustard. Meat flavorings and tenderizers. Bouillon cubes. Hot sauce. Tabasco sauce. Marinades. Taco seasonings. Relishes. Fats and Oils Butter, stick margarine,  lard, shortening, ghee, and bacon fat. Coconut, palm kernel, or palm oils. Regular salad dressings. Other Pickles and olives. Salted popcorn and pretzels. The items listed above may not be a complete list of foods and beverages to avoid. Contact your dietitian for more information. WHERE CAN I FIND MORE INFORMATION? National Heart, Lung, and Blood Institute: travelstabloid.com   This information is not intended to replace advice given to you by your health care provider. Make sure you discuss any questions you have with your health care provider.   Document Released: 11/25/2011 Document Revised: 12/27/2014 Document Reviewed: 10/10/2013 Elsevier Interactive Patient Education [employer].

## 2015-10-14 MED ORDER — EZETIMIBE 10 MG PO TABS: 10.0000 mg | ORAL_TABLET | Freq: Every day | ORAL | Status: DC

## 2015-10-14 NOTE — Assessment & Plan Note (Addendum)
Uncontrolled, with high LDL; he is willing to try zetia; he did not tolerate statins; if zetia does not bring LDL down, consider newer PCSK9 inhibitor

## 2015-10-14 NOTE — Assessment & Plan Note (Addendum)
New problem; feet not involved; not typical for dishydrotic eczema; if not improving with oral anti-histamine and topical cream, consider further testing

## 2015-10-14 NOTE — Assessment & Plan Note (Signed)
Noted to wax and wane during visit; he has seen neurologist

## 2015-11-03 ENCOUNTER — Ambulatory Visit: Payer: BLUE CROSS/BLUE SHIELD | Admitting: Family Medicine

## 2015-12-02 ENCOUNTER — Ambulatory Visit: Payer: BLUE CROSS/BLUE SHIELD | Admitting: Neurology

## 2015-12-02 ENCOUNTER — Telehealth: Payer: Self-pay | Admitting: Family Medicine

## 2015-12-02 NOTE — Telephone Encounter (Signed)
Patient no showed with neurologist for tremor today; appreciate being notified

## 2015-12-02 NOTE — Telephone Encounter (Signed)
-----   Message from [place], DO sent at 12/02/2015  9:59 AM EST ----- I wanted to let you know that patient n/s his appointment today.

## 2016-05-18 ENCOUNTER — Telehealth: Payer: Self-pay

## 2016-05-18 NOTE — Telephone Encounter (Signed)
error 

## 2016-05-19 ENCOUNTER — Other Ambulatory Visit: Payer: Self-pay | Admitting: Family Medicine

## 2016-05-19 MED ORDER — PROPRANOLOL HCL 10 MG PO TABS: 10.0000 mg | ORAL_TABLET | Freq: Three times a day (TID) | ORAL | Status: DC

## 2016-07-23 DIAGNOSIS — I2510 Atherosclerotic heart disease of native coronary artery without angina pectoris: Secondary | ICD-10-CM | POA: Insufficient documentation

## 2017-07-05 ENCOUNTER — Encounter: Payer: Self-pay | Admitting: Unknown Physician Specialty

## 2017-07-05 ENCOUNTER — Ambulatory Visit (INDEPENDENT_AMBULATORY_CARE_PROVIDER_SITE_OTHER): Payer: BLUE CROSS/BLUE SHIELD | Admitting: Unknown Physician Specialty

## 2017-07-05 VITALS — BP 124/87 | HR 80 | Temp 98.7°F | Ht 66.2 in | Wt 166.4 lb

## 2017-07-05 DIAGNOSIS — Z0001 Encounter for general adult medical examination with abnormal findings: Secondary | ICD-10-CM

## 2017-07-05 DIAGNOSIS — H6690 Otitis media, unspecified, unspecified ear: Secondary | ICD-10-CM

## 2017-07-05 DIAGNOSIS — E785 Hyperlipidemia, unspecified: Secondary | ICD-10-CM | POA: Diagnosis not present

## 2017-07-05 DIAGNOSIS — Z720 Tobacco use: Secondary | ICD-10-CM | POA: Diagnosis not present

## 2017-07-05 DIAGNOSIS — I2510 Atherosclerotic heart disease of native coronary artery without angina pectoris: Secondary | ICD-10-CM | POA: Diagnosis not present

## 2017-07-05 DIAGNOSIS — Z0000 Encounter for general adult medical examination without abnormal findings: Secondary | ICD-10-CM | POA: Diagnosis not present

## 2017-07-05 DIAGNOSIS — G43009 Migraine without aura, not intractable, without status migrainosus: Secondary | ICD-10-CM

## 2017-07-05 DIAGNOSIS — R251 Tremor, unspecified: Secondary | ICD-10-CM | POA: Diagnosis not present

## 2017-07-05 MED ORDER — SIMVASTATIN 20 MG PO TABS: 20.0000 mg | ORAL_TABLET | Freq: Every day | ORAL | 2 refills | Status: DC

## 2017-07-05 MED ORDER — AMOXICILLIN 875 MG PO TABS: 875.0000 mg | ORAL_TABLET | Freq: Two times a day (BID) | ORAL | 0 refills | Status: DC

## 2017-07-05 NOTE — Assessment & Plan Note (Addendum)
Pt agrees to another trial of a statin.  Start Simvastatin 20 mg.  Recheck in 6 weeks

## 2017-07-05 NOTE — Assessment & Plan Note (Signed)
Continue Propranalol.  Cut back on caffeine.  Not a good candidate for Imitrex due to CAD

## 2017-07-05 NOTE — Assessment & Plan Note (Signed)
stable °

## 2017-07-05 NOTE — Progress Notes (Signed)
BP 124/87 (BP Location: Left Arm, Cuff Size: Normal)   Pulse 80   Temp 98.7 F (37.1 C)   Ht 5' 6.2" (1.681 m)   Wt 166 lb 6.4 oz (75.5 kg)   SpO2 97%   BMI 26.70 kg/m    Subjective:    Patient ID: Brad Cole, male    DOB: 04-29-1961, 56 y.o.   MRN: [id number]  HPI: Brad Cole is a 56 y.o. male  Chief Complaint  Patient presents with  . Annual Exam    Sore Throat   This is a new problem. The current episode started in the past 7 days (wonder if he got it at [facility]). The problem has been unchanged. There has been no fever. Pertinent negatives include no congestion, ear pain, headaches, plugged ear sensation, shortness of breath or swollen glands. Treatments tried: BC powder. The treatment provided no relief.   CAD Has seen Dr. Nehemiah Massed and tried 2 different statins.  States he tried Atorvastatin and just "laid" there.  States the second one he tried he never got in the routine.  He is willing to try another medication.   Tobacco Pt states he is not ready to quit.  Smokes less than 1 ppd.   Migraine "at least once a week."  Starts in the AM and worsens throughout the day.  States Propranalol has helped but them back some.  Drinks 2 cups of coffee/day.    Depression screen [facility] 2/9 07/05/2017  Decreased Interest 0  Down, Depressed, Hopeless 0  PHQ - 2 Score 0  Altered sleeping 0  Tired, decreased energy 1  Change in appetite 1  Feeling bad or failure about yourself  0  Trouble concentrating 0  Moving slowly or fidgety/restless 1  Suicidal thoughts 0  PHQ-9 Score 3     Social History   Social History  . Marital status: Married    Spouse name: N/A  . Number of children: N/A  . Years of education: N/A   Occupational History  . Not on file.   Social History Main Topics  . Smoking status: Current Every Day Smoker    Packs/day: 0.75    Types: Cigarettes  . Smokeless tobacco: Never Used  . Alcohol use No  . Drug use: No  . Sexual  activity: Yes   Other Topics Concern  . Not on file   Social History Narrative  . No narrative on file   Family History  Problem Relation Age of Onset  . Cancer Father        pancreas  . Heart disease Father   . Seizures Sister   . Cerebral palsy Sister   . Leukemia Maternal Grandmother    Past Medical History:  Diagnosis Date  . CAD (coronary artery disease)   . Hyperlipidemia   . Hypertension   . Migraine   . Tobacco abuse    Past Surgical History:  Procedure Laterality Date  . CORONARY ANGIOPLASTY WITH STENT PLACEMENT  2014   Dr. Nehemiah Massed  . DENTAL RESTORATION/EXTRACTION WITH X-RAY     full upper dental extraction  . TONSILLECTOMY    . WISDOM TOOTH EXTRACTION      Relevant past medical, surgical, family and social history reviewed and updated as indicated. Interim medical history since our last visit reviewed. Allergies and medications reviewed and updated.  Review of Systems  HENT: Negative for congestion and ear pain.   Respiratory: Negative for shortness of breath.   Neurological: Negative  for headaches.    Per HPI unless specifically indicated above     Objective:    BP 124/87 (BP Location: Left Arm, Cuff Size: Normal)   Pulse 80   Temp 98.7 F (37.1 C)   Ht 5' 6.2" (1.681 m)   Wt 166 lb 6.4 oz (75.5 kg)   SpO2 97%   BMI 26.70 kg/m   Wt Readings from Last 3 Encounters:  07/05/17 166 lb 6.4 oz (75.5 kg)  10/09/15 171 lb (77.6 kg)  08/01/15 171 lb (77.6 kg)    Physical Exam  Constitutional: He is oriented to person, place, and time. He appears well-developed and well-nourished.  HENT:  Head: Normocephalic.  Right Ear: Tympanic membrane, external ear and ear canal normal.  Left Ear: External ear and ear canal normal. A middle ear effusion is present.  Mouth/Throat: Uvula is midline and mucous membranes are normal. Posterior oropharyngeal erythema present.  Eyes: Pupils are equal, round, and reactive to light.  Cardiovascular: Normal rate,  regular rhythm and normal heart sounds.  Exam reveals no gallop and no friction rub.   No murmur heard. Pulmonary/Chest: Effort normal and breath sounds normal. No respiratory distress.  Abdominal: Soft. Bowel sounds are normal. He exhibits no distension. There is no tenderness.  Musculoskeletal: Normal range of motion.  Neurological: He is alert and oriented to person, place, and time. He has normal reflexes.  Skin: Skin is warm and dry.  Psychiatric: He has a normal mood and affect. His behavior is normal. Judgment and thought content normal.    Results for orders placed or performed in visit on 07/11/15  CBC with Differential/Platelet  Result Value Ref Range   WBC 8.4 3.4 - 10.8 x10E3/uL   RBC 4.98 4.14 - 5.80 x10E6/uL   Hemoglobin 16.9 12.6 - 17.7 g/dL   Hematocrit 49.1 37.5 - 51.0 %   MCV 99 (H) 79 - 97 fL   MCH 33.9 (H) 26.6 - 33.0 pg   MCHC 34.4 31.5 - 35.7 g/dL   RDW 12.9 12.3 - 15.4 %   Platelets 221 150 - 379 x10E3/uL   Neutrophils 55 %   Lymphs 36 %   Monocytes 6 %   Eos 2 %   Basos 1 %   Neutrophils Absolute 4.6 1.4 - 7.0 x10E3/uL   Lymphocytes Absolute 3.0 0.7 - 3.1 x10E3/uL   Monocytes Absolute 0.5 0.1 - 0.9 x10E3/uL   EOS (ABSOLUTE) 0.1 0.0 - 0.4 x10E3/uL   Basophils Absolute 0.0 0.0 - 0.2 x10E3/uL   Immature Granulocytes 0 %   Immature Grans (Abs) 0.0 0.0 - 0.1 x10E3/uL  Comprehensive metabolic panel  Result Value Ref Range   Glucose 101 (H) 65 - 99 mg/dL   BUN 12 6 - 24 mg/dL   Creatinine, Ser 1.01 0.76 - 1.27 mg/dL   GFR calc non Af Amer 86 >59 mL/min/1.73   GFR calc Af Amer 99 >59 mL/min/1.73   BUN/Creatinine Ratio 12 9 - 20   Sodium 138 134 - 144 mmol/L   Potassium 4.1 3.5 - 5.2 mmol/L   Chloride 100 97 - 108 mmol/L   CO2 18 18 - 29 mmol/L   Calcium 9.5 8.7 - 10.2 mg/dL   Total Protein 6.6 6.0 - 8.5 g/dL   Albumin 4.4 3.5 - 5.5 g/dL   Globulin, Total 2.2 1.5 - 4.5 g/dL   Albumin/Globulin Ratio 2.0 1.1 - 2.5   Bilirubin Total 0.2 0.0 - 1.2 mg/dL     Alkaline Phosphatase 80 39 -  117 IU/L   AST 15 0 - 40 IU/L   ALT 24 0 - 44 IU/L  TSH  Result Value Ref Range   TSH 1.850 0.450 - 4.500 uIU/mL  Lipid Panel w/o Chol/HDL Ratio  Result Value Ref Range   Cholesterol, Total 257 (H) 100 - 199 mg/dL   Triglycerides 117 0 - 149 mg/dL   HDL 35 (L) >39 mg/dL   VLDL Cholesterol Cal 23 5 - 40 mg/dL   LDL Calculated 199 (H) 0 - 99 mg/dL      Assessment & Plan:   Problem List Items Addressed This Visit      Unprioritized   CAD (coronary artery disease)    Not on statin for risk reduction      Relevant Medications   simvastatin (ZOCOR) 20 MG tablet   Hyperlipidemia    Pt agrees to another trial of a statin.  Start Simvastatin 20 mg.  Recheck in 6 weeks      Relevant Medications   simvastatin (ZOCOR) 20 MG tablet   Other Relevant Orders   Comprehensive metabolic panel   Lipid Panel w/o Chol/HDL Ratio   Migraine    Continue Propranalol.  Cut back on caffeine.  Not a good candidate for Imitrex due to CAD      Relevant Medications   simvastatin (ZOCOR) 20 MG tablet   Tobacco abuse    Not ready to quit at this time      Tremor    stable       Other Visit Diagnoses    Annual physical exam    -  Primary   Relevant Orders   CBC with Differential/Platelet   Comprehensive metabolic panel   PSA   TSH   Acute otitis media, unspecified otitis media type       left ear.  Rx for Amoxil   Relevant Medications   amoxicillin (AMOXIL) 875 MG tablet       Follow up plan: Return in about 6 weeks (around 08/16/2017).

## 2017-07-05 NOTE — Assessment & Plan Note (Signed)
Not ready to quit at this time.

## 2017-07-05 NOTE — Assessment & Plan Note (Signed)
Not on statin for risk reduction

## 2017-07-06 LAB — CBC WITH DIFFERENTIAL/PLATELET
BASOS: 0 %
Basophils Absolute: 0 10*3/uL (ref 0.0–0.2)
EOS (ABSOLUTE): 0.2 10*3/uL (ref 0.0–0.4)
Eos: 2 %
Hematocrit: 47.5 % (ref 37.5–51.0)
Hemoglobin: 16.8 g/dL (ref 13.0–17.7)
Immature Grans (Abs): 0 10*3/uL (ref 0.0–0.1)
Immature Granulocytes: 0 %
LYMPHS ABS: 2.3 10*3/uL (ref 0.7–3.1)
Lymphs: 24 %
MCH: 34.6 pg — AB (ref 26.6–33.0)
MCHC: 35.4 g/dL (ref 31.5–35.7)
MCV: 98 fL — AB (ref 79–97)
MONOS ABS: 0.7 10*3/uL (ref 0.1–0.9)
Monocytes: 7 %
NEUTROS ABS: 6.3 10*3/uL (ref 1.4–7.0)
Neutrophils: 67 %
PLATELETS: 237 10*3/uL (ref 150–379)
RBC: 4.85 x10E6/uL (ref 4.14–5.80)
RDW: 12.9 % (ref 12.3–15.4)
WBC: 9.4 10*3/uL (ref 3.4–10.8)

## 2017-07-06 LAB — COMPREHENSIVE METABOLIC PANEL
A/G RATIO: 1.9 (ref 1.2–2.2)
ALT: 23 IU/L (ref 0–44)
AST: 18 IU/L (ref 0–40)
Albumin: 4.7 g/dL (ref 3.5–5.5)
Alkaline Phosphatase: 91 IU/L (ref 39–117)
BILIRUBIN TOTAL: 0.2 mg/dL (ref 0.0–1.2)
BUN/Creatinine Ratio: 13 (ref 9–20)
BUN: 14 mg/dL (ref 6–24)
CHLORIDE: 103 mmol/L (ref 96–106)
CO2: 21 mmol/L (ref 20–29)
Calcium: 9.6 mg/dL (ref 8.7–10.2)
Creatinine, Ser: 1.07 mg/dL (ref 0.76–1.27)
GFR calc non Af Amer: 79 mL/min/{1.73_m2} (ref >59)
GFR, EST AFRICAN AMERICAN: 91 mL/min/{1.73_m2} (ref >59)
Globulin, Total: 2.5 g/dL (ref 1.5–4.5)
Glucose: 93 mg/dL (ref 65–99)
POTASSIUM: 4.1 mmol/L (ref 3.5–5.2)
Sodium: 142 mmol/L (ref 134–144)
TOTAL PROTEIN: 7.2 g/dL (ref 6.0–8.5)

## 2017-07-06 LAB — LIPID PANEL W/O CHOL/HDL RATIO
Cholesterol, Total: 258 mg/dL — ABNORMAL HIGH (ref 100–199)
HDL: 42 mg/dL (ref >39)
LDL Calculated: 202 mg/dL — ABNORMAL HIGH (ref 0–99)
Triglycerides: 72 mg/dL (ref 0–149)
VLDL Cholesterol Cal: 14 mg/dL (ref 5–40)

## 2017-07-06 LAB — TSH: TSH: 1.77 u[IU]/mL (ref 0.450–4.500)

## 2017-07-06 LAB — PSA: Prostate Specific Ag, Serum: 1.9 ng/mL (ref 0.0–4.0)

## 2017-07-20 ENCOUNTER — Telehealth: Payer: Self-pay | Admitting: Unknown Physician Specialty

## 2017-07-20 MED ORDER — AMOXICILLIN-POT CLAVULANATE 875-125 MG PO TABS: 1.0000 | ORAL_TABLET | Freq: Two times a day (BID) | ORAL | 0 refills | Status: DC

## 2017-07-20 NOTE — Telephone Encounter (Signed)
I wrote Augmentin.  It's a little stronger

## 2017-07-20 NOTE — Telephone Encounter (Signed)
Patient finished his antibiotic 3-4 days ago and his sore throat and ear has started running again.  He is hoping Malachy Mood will call him in another antibiotic   [place] [phone]  Thanks

## 2017-07-20 NOTE — Telephone Encounter (Signed)
Routing to provider  

## 2017-07-20 NOTE — Telephone Encounter (Signed)
Called and left patient a VM (not detailed) letting him know that another medication was sent in to his pharmacy for him.

## 2017-08-17 ENCOUNTER — Ambulatory Visit (INDEPENDENT_AMBULATORY_CARE_PROVIDER_SITE_OTHER): Payer: BLUE CROSS/BLUE SHIELD | Admitting: Unknown Physician Specialty

## 2017-08-17 ENCOUNTER — Encounter: Payer: Self-pay | Admitting: Unknown Physician Specialty

## 2017-08-17 ENCOUNTER — Other Ambulatory Visit: Payer: Self-pay | Admitting: Unknown Physician Specialty

## 2017-08-17 VITALS — BP 131/85 | HR 67 | Temp 98.2°F | Wt 166.0 lb

## 2017-08-17 DIAGNOSIS — H6590 Unspecified nonsuppurative otitis media, unspecified ear: Secondary | ICD-10-CM | POA: Insufficient documentation

## 2017-08-17 DIAGNOSIS — E785 Hyperlipidemia, unspecified: Secondary | ICD-10-CM | POA: Diagnosis not present

## 2017-08-17 DIAGNOSIS — Z5181 Encounter for therapeutic drug level monitoring: Secondary | ICD-10-CM | POA: Diagnosis not present

## 2017-08-17 DIAGNOSIS — H6523 Chronic serous otitis media, bilateral: Secondary | ICD-10-CM | POA: Diagnosis not present

## 2017-08-17 LAB — LIPID PANEL PICCOLO, WAIVED
CHOL/HDL RATIO PICCOLO,WAIVE: 3.8 mg/dL
CHOLESTEROL PICCOLO, WAIVED: 158 mg/dL (ref <200)
HDL CHOL PICCOLO, WAIVED: 42 mg/dL — AB (ref >59)
LDL Chol Calc Piccolo Waived: 102 mg/dL — ABNORMAL HIGH (ref <100)
TRIGLYCERIDES PICCOLO,WAIVED: 73 mg/dL (ref <150)
VLDL Chol Calc Piccolo,Waive: 15 mg/dL (ref <30)

## 2017-08-17 MED ORDER — FLUTICASONE PROPIONATE 50 MCG/ACT NA SUSP: 2.0000 | Freq: Every day | NASAL | 6 refills | Status: DC

## 2017-08-17 NOTE — Progress Notes (Signed)
BP 131/85   Pulse 67   Temp 98.2 F (36.8 C)   Wt 166 lb (75.3 kg)   SpO2 98%   BMI 26.63 kg/m    Subjective:    Patient ID: Brad Cole, male    DOB: 1961/06/06, 56 y.o.   MRN: [id number]  HPI: Brad Cole is a 56 y.o. male  Chief Complaint  Patient presents with  . Hyperlipidemia    6 week f/up  . Ear Drainage    pt states he finished the amoxicillin given, 2 days later, ear drainage was starting again   Ear drainage Notes this at night and throat gets sore.  Antibiotics seem to help but result is temporary.  One round of Amoxicillin and 1 round of Augmentin  CAD Pt states he is doing well with Simvistatin 20 mg.  States it makes him "sleep like a rock."  But it takes him a bit of time to wake   Relevant past medical, surgical, family and social history reviewed and updated as indicated. Interim medical history since our last visit reviewed. Allergies and medications reviewed and updated.  Review of Systems  Per HPI unless specifically indicated above     Objective:    BP 131/85   Pulse 67   Temp 98.2 F (36.8 C)   Wt 166 lb (75.3 kg)   SpO2 98%   BMI 26.63 kg/m   Wt Readings from Last 3 Encounters:  08/17/17 166 lb (75.3 kg)  07/05/17 166 lb 6.4 oz (75.5 kg)  10/09/15 171 lb (77.6 kg)    Physical Exam  Constitutional: He is oriented to person, place, and time. He appears well-developed and well-nourished. No distress.  HENT:  Head: Normocephalic and atraumatic.  Right Ear: Hearing normal. No drainage. A middle ear effusion is present.  Left Ear: Hearing normal. No drainage. A middle ear effusion is present.  Eyes: Conjunctivae and lids are normal. Right eye exhibits no discharge. Left eye exhibits no discharge. No scleral icterus.  Neck: Normal range of motion. Neck supple. No JVD present. Carotid bruit is not present.  Cardiovascular: Normal rate, regular rhythm and normal heart sounds.   Pulmonary/Chest: Effort normal and breath  sounds normal. No respiratory distress.  Abdominal: Normal appearance. There is no splenomegaly or hepatomegaly.  Musculoskeletal: Normal range of motion.  Neurological: He is alert and oriented to person, place, and time.  Skin: Skin is warm, dry and intact. No rash noted. No pallor.  Psychiatric: He has a normal mood and affect. His behavior is normal. Judgment and thought content normal.    Results for orders placed or performed in visit on 07/05/17  CBC with Differential/Platelet  Result Value Ref Range   WBC 9.4 3.4 - 10.8 x10E3/uL   RBC 4.85 4.14 - 5.80 x10E6/uL   Hemoglobin 16.8 13.0 - 17.7 g/dL   Hematocrit 47.5 37.5 - 51.0 %   MCV 98 (H) 79 - 97 fL   MCH 34.6 (H) 26.6 - 33.0 pg   MCHC 35.4 31.5 - 35.7 g/dL   RDW 12.9 12.3 - 15.4 %   Platelets 237 150 - 379 x10E3/uL   Neutrophils 67 Not Estab. %   Lymphs 24 Not Estab. %   Monocytes 7 Not Estab. %   Eos 2 Not Estab. %   Basos 0 Not Estab. %   Neutrophils Absolute 6.3 1.4 - 7.0 x10E3/uL   Lymphocytes Absolute 2.3 0.7 - 3.1 x10E3/uL   Monocytes Absolute 0.7 0.1 - 0.9  x10E3/uL   EOS (ABSOLUTE) 0.2 0.0 - 0.4 x10E3/uL   Basophils Absolute 0.0 0.0 - 0.2 x10E3/uL   Immature Granulocytes 0 Not Estab. %   Immature Grans (Abs) 0.0 0.0 - 0.1 x10E3/uL  Comprehensive metabolic panel  Result Value Ref Range   Glucose 93 65 - 99 mg/dL   BUN 14 6 - 24 mg/dL   Creatinine, Ser 1.07 0.76 - 1.27 mg/dL   GFR calc non Af Amer 79 >59 mL/min/1.73   GFR calc Af Amer 91 >59 mL/min/1.73   BUN/Creatinine Ratio 13 9 - 20   Sodium 142 134 - 144 mmol/L   Potassium 4.1 3.5 - 5.2 mmol/L   Chloride 103 96 - 106 mmol/L   CO2 21 20 - 29 mmol/L   Calcium 9.6 8.7 - 10.2 mg/dL   Total Protein 7.2 6.0 - 8.5 g/dL   Albumin 4.7 3.5 - 5.5 g/dL   Globulin, Total 2.5 1.5 - 4.5 g/dL   Albumin/Globulin Ratio 1.9 1.2 - 2.2   Bilirubin Total 0.2 0.0 - 1.2 mg/dL   Alkaline Phosphatase 91 39 - 117 IU/L   AST 18 0 - 40 IU/L   ALT 23 0 - 44 IU/L  Lipid Panel  w/o Chol/HDL Ratio  Result Value Ref Range   Cholesterol, Total 258 (H) 100 - 199 mg/dL   Triglycerides 72 0 - 149 mg/dL   HDL 42 >39 mg/dL   VLDL Cholesterol Cal 14 5 - 40 mg/dL   LDL Calculated 202 (H) 0 - 99 mg/dL  PSA  Result Value Ref Range   Prostate Specific Ag, Serum 1.9 0.0 - 4.0 ng/mL  TSH  Result Value Ref Range   TSH 1.770 0.450 - 4.500 uIU/mL      Assessment & Plan:   Problem List Items Addressed This Visit      Unprioritized   Hyperlipidemia - Primary    Check lipid panel.  Will call or send letter with results      Relevant Orders   Lipid Panel Piccolo, Waived   Comprehensive metabolic panel   Serous otitis media    Rx for Flonase.  Will also refer to ENT with persistent symptoms       Other Visit Diagnoses    Medication monitoring encounter       Relevant Orders   Comprehensive metabolic panel       Follow up plan: Return in about 6 months (around 02/16/2018).

## 2017-08-17 NOTE — Assessment & Plan Note (Signed)
Check lipid panel.  Will call or send letter with results

## 2017-08-17 NOTE — Assessment & Plan Note (Signed)
Rx for Flonase.  Will also refer to ENT with persistent symptoms

## 2017-08-18 LAB — COMPREHENSIVE METABOLIC PANEL
ALBUMIN: 4.5 g/dL (ref 3.5–5.5)
ALT: 29 IU/L (ref 0–44)
AST: 25 IU/L (ref 0–40)
Albumin/Globulin Ratio: 2 (ref 1.2–2.2)
Alkaline Phosphatase: 73 IU/L (ref 39–117)
BUN / CREAT RATIO: 19 (ref 9–20)
BUN: 19 mg/dL (ref 6–24)
Bilirubin Total: 0.3 mg/dL (ref 0.0–1.2)
CO2: 19 mmol/L — AB (ref 20–29)
CREATININE: 0.99 mg/dL (ref 0.76–1.27)
Calcium: 9.4 mg/dL (ref 8.7–10.2)
Chloride: 110 mmol/L — ABNORMAL HIGH (ref 96–106)
GFR calc non Af Amer: 87 mL/min/{1.73_m2} (ref >59)
GFR, EST AFRICAN AMERICAN: 100 mL/min/{1.73_m2} (ref >59)
GLUCOSE: 94 mg/dL (ref 65–99)
Globulin, Total: 2.2 g/dL (ref 1.5–4.5)
Potassium: 4.2 mmol/L (ref 3.5–5.2)
Sodium: 144 mmol/L (ref 134–144)
TOTAL PROTEIN: 6.7 g/dL (ref 6.0–8.5)

## 2017-09-26 DIAGNOSIS — I25118 Atherosclerotic heart disease of native coronary artery with other forms of angina pectoris: Secondary | ICD-10-CM | POA: Diagnosis not present

## 2017-09-26 DIAGNOSIS — Z720 Tobacco use: Secondary | ICD-10-CM | POA: Diagnosis not present

## 2017-09-26 DIAGNOSIS — E7801 Familial hypercholesterolemia: Secondary | ICD-10-CM | POA: Diagnosis not present

## 2017-09-26 DIAGNOSIS — I10 Essential (primary) hypertension: Secondary | ICD-10-CM | POA: Diagnosis not present

## 2018-07-27 ENCOUNTER — Ambulatory Visit (INDEPENDENT_AMBULATORY_CARE_PROVIDER_SITE_OTHER): Payer: BLUE CROSS/BLUE SHIELD | Admitting: Family Medicine

## 2018-07-27 ENCOUNTER — Encounter: Payer: Self-pay | Admitting: Family Medicine

## 2018-07-27 DIAGNOSIS — L02212 Cutaneous abscess of back [any part, except buttock]: Secondary | ICD-10-CM | POA: Insufficient documentation

## 2018-07-27 DIAGNOSIS — I10 Essential (primary) hypertension: Secondary | ICD-10-CM

## 2018-07-27 MED ORDER — PROPRANOLOL HCL 10 MG PO TABS
10.0000 mg | ORAL_TABLET | Freq: Three times a day (TID) | ORAL | 3 refills | Status: DC
Start: 2018-07-27 — End: 2018-12-20

## 2018-07-27 NOTE — Assessment & Plan Note (Signed)
Refilled patient's medications

## 2018-07-27 NOTE — Progress Notes (Signed)
BP (!) 151/91   Pulse 92   Ht 5\' 9"  (1.753 m)   Wt 173 lb (78.5 kg)   SpO2 99%   BMI 25.55 kg/m    Subjective:    Patient ID: Brad Cole, male    DOB: [birth year], 57 y.o.   MRN: [id number]  HPI: Brad Cole is a 57 y.o. male  Chief Complaint  Patient presents with  . Cyst  . Medication Refill  Patient anxious has cyst posterior neck area which is getting infected and bothersome.  Wants to have removed. Blood pressures been somewhat elevated. Has been taking propanolol which helps with tremors, blood pressure, and headaches.  Patient was wanting a refill and is run out.  Relevant past medical, surgical, family and social history reviewed and updated as indicated. Interim medical history since our last visit reviewed. Allergies and medications reviewed and updated.  Review of Systems  Constitutional: Negative.   Respiratory: Negative.   Cardiovascular: Negative.     Per HPI unless specifically indicated above     Objective:    BP (!) 151/91   Pulse 92   Ht 5\' 9"  (1.753 m)   Wt 173 lb (78.5 kg)   SpO2 99%   BMI 25.55 kg/m   Wt Readings from Last 3 Encounters:  07/27/18 173 lb (78.5 kg)  08/17/17 166 lb (75.3 kg)  07/05/17 166 lb 6.4 oz (75.5 kg)    Physical Exam  Constitutional: He is oriented to person, place, and time. He appears well-developed and well-nourished.  HENT:  Head: Normocephalic and atraumatic.  Eyes: Conjunctivae and EOM are normal.  Neck: Normal range of motion.  Cardiovascular: Normal rate, regular rhythm and normal heart sounds.  Pulmonary/Chest: Effort normal and breath sounds normal.  Musculoskeletal: Normal range of motion.  Neurological: He is alert and oriented to person, place, and time.  Skin: No erythema.  After informed consent area was prepped with Betadine alcohol infiltrated with Xylocaine and epinephrine.  Area was incised and drained large amount of purulent material. Sac was removed and cyst excision  and drainage was complicated due to its size and complicated sac removal required sharp dissection.. Patient tolerated procedure well sac was removed in its entirety that we could visualize. Patient education given on wound care.   Psychiatric: He has a normal mood and affect. His behavior is normal. Judgment and thought content normal.    Results for orders placed or performed in visit on 08/17/17  Comprehensive metabolic panel  Result Value Ref Range   Glucose 94 65 - 99 mg/dL   BUN 19 6 - 24 mg/dL   Creatinine, Ser 0.99 0.76 - 1.27 mg/dL   GFR calc non Af Amer 87 >59 mL/min/1.73   GFR calc Af Amer 100 >59 mL/min/1.73   BUN/Creatinine Ratio 19 9 - 20   Sodium 144 134 - 144 mmol/L   Potassium 4.2 3.5 - 5.2 mmol/L   Chloride 110 (H) 96 - 106 mmol/L   CO2 19 (L) 20 - 29 mmol/L   Calcium 9.4 8.7 - 10.2 mg/dL   Total Protein 6.7 6.0 - 8.5 g/dL   Albumin 4.5 3.5 - 5.5 g/dL   Globulin, Total 2.2 1.5 - 4.5 g/dL   Albumin/Globulin Ratio 2.0 1.2 - 2.2   Bilirubin Total 0.3 0.0 - 1.2 mg/dL   Alkaline Phosphatase 73 39 - 117 IU/L   AST 25 0 - 40 IU/L   ALT 29 0 - 44 IU/L  Assessment & Plan:   Problem List Items Addressed This Visit      Cardiovascular and Mediastinum   Hypertension    Refilled patient's medications      Relevant Medications   propranolol (INDERAL) 10 MG tablet     Other   Abscess of back    See procedure note above          Follow up plan: Return for Physical Exam.

## 2018-07-27 NOTE — Assessment & Plan Note (Signed)
See procedure note above

## 2018-09-28 DIAGNOSIS — R079 Chest pain, unspecified: Secondary | ICD-10-CM | POA: Diagnosis not present

## 2018-09-28 DIAGNOSIS — I25119 Atherosclerotic heart disease of native coronary artery with unspecified angina pectoris: Secondary | ICD-10-CM | POA: Diagnosis not present

## 2018-09-28 DIAGNOSIS — R0789 Other chest pain: Secondary | ICD-10-CM | POA: Diagnosis not present

## 2018-09-28 DIAGNOSIS — I10 Essential (primary) hypertension: Secondary | ICD-10-CM | POA: Diagnosis not present

## 2018-12-18 ENCOUNTER — Other Ambulatory Visit: Payer: Self-pay

## 2018-12-18 ENCOUNTER — Encounter: Payer: Self-pay | Admitting: Intensive Care

## 2018-12-18 ENCOUNTER — Emergency Department: Payer: BLUE CROSS/BLUE SHIELD

## 2018-12-18 ENCOUNTER — Inpatient Hospital Stay
Admission: EM | Admit: 2018-12-18 | Discharge: 2018-12-20 | DRG: 247 | Disposition: A | Discharge status: Home / Self Care | Payer: BLUE CROSS/BLUE SHIELD | Attending: Internal Medicine | Admitting: Internal Medicine

## 2018-12-18 DIAGNOSIS — R079 Chest pain, unspecified: Secondary | ICD-10-CM

## 2018-12-18 DIAGNOSIS — Z8249 Family history of ischemic heart disease and other diseases of the circulatory system: Secondary | ICD-10-CM | POA: Diagnosis not present

## 2018-12-18 DIAGNOSIS — I25110 Atherosclerotic heart disease of native coronary artery with unstable angina pectoris: Secondary | ICD-10-CM | POA: Diagnosis not present

## 2018-12-18 DIAGNOSIS — Z806 Family history of leukemia: Secondary | ICD-10-CM

## 2018-12-18 DIAGNOSIS — R0602 Shortness of breath: Secondary | ICD-10-CM | POA: Diagnosis not present

## 2018-12-18 DIAGNOSIS — Z955 Presence of coronary angioplasty implant and graft: Secondary | ICD-10-CM | POA: Diagnosis not present

## 2018-12-18 DIAGNOSIS — Z9114 Patient's other noncompliance with medication regimen: Secondary | ICD-10-CM

## 2018-12-18 DIAGNOSIS — R7989 Other specified abnormal findings of blood chemistry: Secondary | ICD-10-CM | POA: Diagnosis not present

## 2018-12-18 DIAGNOSIS — Z800 Family history of malignant neoplasm of digestive organs: Secondary | ICD-10-CM

## 2018-12-18 DIAGNOSIS — I200 Unstable angina: Secondary | ICD-10-CM | POA: Diagnosis present

## 2018-12-18 DIAGNOSIS — I2510 Atherosclerotic heart disease of native coronary artery without angina pectoris: Secondary | ICD-10-CM | POA: Diagnosis not present

## 2018-12-18 DIAGNOSIS — F17210 Nicotine dependence, cigarettes, uncomplicated: Secondary | ICD-10-CM | POA: Diagnosis present

## 2018-12-18 DIAGNOSIS — Z79899 Other long term (current) drug therapy: Secondary | ICD-10-CM

## 2018-12-18 DIAGNOSIS — Z7951 Long term (current) use of inhaled steroids: Secondary | ICD-10-CM | POA: Diagnosis not present

## 2018-12-18 DIAGNOSIS — E785 Hyperlipidemia, unspecified: Secondary | ICD-10-CM | POA: Diagnosis present

## 2018-12-18 DIAGNOSIS — R778 Other specified abnormalities of plasma proteins: Secondary | ICD-10-CM

## 2018-12-18 DIAGNOSIS — I214 Non-ST elevation (NSTEMI) myocardial infarction: Principal | ICD-10-CM

## 2018-12-18 DIAGNOSIS — R059 Cough, unspecified: Secondary | ICD-10-CM

## 2018-12-18 DIAGNOSIS — R05 Cough: Secondary | ICD-10-CM | POA: Diagnosis not present

## 2018-12-18 DIAGNOSIS — K08409 Partial loss of teeth, unspecified cause, unspecified class: Secondary | ICD-10-CM | POA: Diagnosis present

## 2018-12-18 DIAGNOSIS — Z888 Allergy status to other drugs, medicaments and biological substances status: Secondary | ICD-10-CM | POA: Diagnosis not present

## 2018-12-18 DIAGNOSIS — I10 Essential (primary) hypertension: Secondary | ICD-10-CM | POA: Diagnosis not present

## 2018-12-18 DIAGNOSIS — Z716 Tobacco abuse counseling: Secondary | ICD-10-CM | POA: Diagnosis not present

## 2018-12-18 DIAGNOSIS — Z720 Tobacco use: Secondary | ICD-10-CM | POA: Diagnosis not present

## 2018-12-18 DIAGNOSIS — E7801 Familial hypercholesterolemia: Secondary | ICD-10-CM | POA: Diagnosis not present

## 2018-12-18 LAB — CBC
HCT: 45.6 % (ref 39.0–52.0)
Hemoglobin: 15.7 g/dL (ref 13.0–17.0)
MCH: 33.3 pg (ref 26.0–34.0)
MCHC: 34.4 g/dL (ref 30.0–36.0)
MCV: 96.6 fL (ref 80.0–100.0)
NRBC: 0 % (ref 0.0–0.2)
Platelets: 181 10*3/uL (ref 150–400)
RBC: 4.72 MIL/uL (ref 4.22–5.81)
RDW: 12 % (ref 11.5–15.5)
WBC: 6.9 10*3/uL (ref 4.0–10.5)

## 2018-12-18 LAB — APTT: aPTT: 31 seconds (ref 24–36)

## 2018-12-18 LAB — BASIC METABOLIC PANEL
Anion gap: 9 (ref 5–15)
BUN: 20 mg/dL (ref 6–20)
CO2: 25 mmol/L (ref 22–32)
Calcium: 8.7 mg/dL — ABNORMAL LOW (ref 8.9–10.3)
Chloride: 106 mmol/L (ref 98–111)
Creatinine, Ser: 0.94 mg/dL (ref 0.61–1.24)
Glucose, Bld: 99 mg/dL (ref 70–99)
Potassium: 3.4 mmol/L — ABNORMAL LOW (ref 3.5–5.1)
Sodium: 140 mmol/L (ref 135–145)

## 2018-12-18 LAB — TROPONIN I: Troponin I: 0.28 ng/mL (ref <0.03)

## 2018-12-18 LAB — PROTIME-INR
INR: 0.93
Prothrombin Time: 12.4 seconds (ref 11.4–15.2)

## 2018-12-18 MED ORDER — ACETAMINOPHEN 650 MG RE SUPP: 650.0000 mg | Freq: Four times a day (QID) | RECTAL | Status: DC | PRN

## 2018-12-18 MED ORDER — METOPROLOL TARTRATE 25 MG PO TABS
25.0000 mg | ORAL_TABLET | Freq: Two times a day (BID) | ORAL | Status: DC
  Administered 2018-12-18 – 2018-12-20 (×2): 25 mg via ORAL
  Filled 2018-12-18 (×3): qty 1

## 2018-12-18 MED ORDER — ACETAMINOPHEN 325 MG PO TABS: 650.0000 mg | ORAL_TABLET | Freq: Four times a day (QID) | ORAL | Status: DC | PRN

## 2018-12-18 MED ORDER — NICOTINE 21 MG/24HR TD PT24
21.0000 mg | MEDICATED_PATCH | Freq: Every day | TRANSDERMAL | Status: DC
  Filled 2018-12-18 (×2): qty 1

## 2018-12-18 MED ORDER — ASPIRIN 81 MG PO CHEW
324.0000 mg | CHEWABLE_TABLET | Freq: Once | ORAL | Status: DC
  Filled 2018-12-18: qty 4

## 2018-12-18 MED ORDER — ROSUVASTATIN CALCIUM 10 MG PO TABS
20.0000 mg | ORAL_TABLET | Freq: Every day | ORAL | Status: DC
  Administered 2018-12-18 – 2018-12-19 (×2): 20 mg via ORAL
  Filled 2018-12-18: qty 1
  Filled 2018-12-18: qty 2

## 2018-12-18 MED ORDER — ONDANSETRON HCL 4 MG PO TABS: 4.0000 mg | ORAL_TABLET | Freq: Four times a day (QID) | ORAL | Status: DC | PRN

## 2018-12-18 MED ORDER — NITROGLYCERIN 0.4 MG SL SUBL: 0.4000 mg | SUBLINGUAL_TABLET | SUBLINGUAL | Status: DC | PRN

## 2018-12-18 MED ORDER — HEPARIN (PORCINE) 25000 UT/250ML-% IV SOLN
1000.0000 [IU]/h | INTRAVENOUS | Status: DC
  Administered 2018-12-18: 1100 [IU]/h via INTRAVENOUS
  Filled 2018-12-18: qty 250

## 2018-12-18 MED ORDER — NITROGLYCERIN 2 % TD OINT
1.0000 [in_us] | TOPICAL_OINTMENT | Freq: Once | TRANSDERMAL | Status: AC
  Administered 2018-12-18: 1 [in_us] via TOPICAL
  Filled 2018-12-18: qty 1

## 2018-12-18 MED ORDER — SODIUM CHLORIDE 0.9 % IV SOLN
INTRAVENOUS | Status: DC
  Administered 2018-12-19 (×2): via INTRAVENOUS

## 2018-12-18 MED ORDER — ASPIRIN 81 MG PO CHEW
324.0000 mg | CHEWABLE_TABLET | Freq: Once | ORAL | Status: AC
  Administered 2018-12-18: 324 mg via ORAL

## 2018-12-18 MED ORDER — MORPHINE SULFATE (PF) 2 MG/ML IV SOLN: 2.0000 mg | INTRAVENOUS | Status: DC | PRN

## 2018-12-18 MED ORDER — HEPARIN BOLUS VIA INFUSION
4000.0000 [IU] | Freq: Once | INTRAVENOUS | Status: AC
  Administered 2018-12-18: 4000 [IU] via INTRAVENOUS
  Filled 2018-12-18: qty 4000

## 2018-12-18 MED ORDER — HYDROCODONE-ACETAMINOPHEN 5-325 MG PO TABS
1.0000 | ORAL_TABLET | ORAL | Status: DC | PRN
  Administered 2018-12-19: 2 via ORAL
  Administered 2018-12-19: 1 via ORAL
  Filled 2018-12-18: qty 1
  Filled 2018-12-18: qty 2

## 2018-12-18 MED ORDER — ONDANSETRON HCL 4 MG/2ML IJ SOLN: 4.0000 mg | Freq: Four times a day (QID) | INTRAMUSCULAR | Status: DC | PRN

## 2018-12-18 MED ORDER — NITROGLYCERIN 2 % TD OINT: 0.5000 [in_us] | TOPICAL_OINTMENT | Freq: Four times a day (QID) | TRANSDERMAL | Status: DC

## 2018-12-18 NOTE — ED Notes (Signed)
Pt up to room toilet.

## 2018-12-18 NOTE — Consult Note (Signed)
ANTICOAGULATION CONSULT NOTE - Initial Consult  Pharmacy Consult for Heparin Indication: chest pain/ACS  Allergies  Allergen Reactions  . Atorvastatin Other (See Comments)    "makes me feel bad"    Patient Measurements: Height: 5\' 6"  (167.6 cm) Weight: 166 lb (75.3 kg) IBW/kg (Calculated) : 63.8 Heparin Dosing Weight: 75.3kg  Vital Signs: Temp: 98.7 F (37.1 C) (12/30 1513) Temp Source: Oral (12/30 1513) BP: 131/91 (12/30 1513) Pulse Rate: 74 (12/30 1513)  Labs: Recent Labs    12/18/18 1520  HGB 15.7  HCT 45.6  PLT 181  CREATININE 0.94  TROPONINI 0.28*    Estimated Creatinine Clearance: 80.1 mL/min (by C-G formula based on SCr of 0.94 mg/dL).   Medical History: Past Medical History:  Diagnosis Date  . CAD (coronary artery disease)   . Hyperlipidemia   . Hypertension   . Migraine   . Tobacco abuse     Medications:  No pta anticoagulation  Assessment: Pharmacy has been consulted for heparin dosing for ACS/STEMI  Goal of Therapy:  Heparin level 0.3-0.7 units/ml Monitor platelets by anticoagulation protocol: Yes   Plan:  Heparin 4000 unit bolus via infusion, followed by 1100 units/hr.  Will follow HL (anti-xa level) @ 2300 and monitor/adjust accordingly  Lu Duffel, PharmD, BCPS Clinical Pharmacist 12/18/2018 4:35 PM

## 2018-12-18 NOTE — ED Notes (Signed)
Pt denies any needs.

## 2018-12-18 NOTE — ED Notes (Signed)
EDP at bedside  

## 2018-12-18 NOTE — H&P (Signed)
[place] [facility] [place] NAME: Brad Cole    MR#:  [id number]  DATE OF BIRTH:  1961/07/16  DATE OF ADMISSION:  12/18/2018  PRIMARY CARE PHYSICIAN: Kathrine Haddock, NP   REQUESTING/REFERRING PHYSICIAN: Schuyler Amor, MD  CHIEF COMPLAINT:   Chief Complaint  Patient presents with  . Chest Pain    HISTORY OF PRESENT ILLNESS: Brad Cole  is a 57 y.o. male with a known history of coronary artery disease with previous history of stent in 2014 who is presenting with chest pain ongoing for 1 week now.  Patient states that he has been having left-sided chest pain especially with activity.  Also having pain radiating to his bilateral jaws as well as going down his arm.  Similar type of symptoms happen when he had his stent previously.  Patient was seen by his cardiologist who referred him to ED for admission.  He also states that he gets short of breath when he has these chest pains.  Denies any nausea vomiting or diarrhea. PAST MEDICAL HISTORY:   Past Medical History:  Diagnosis Date  . CAD (coronary artery disease)   . Hyperlipidemia   . Hypertension   . Migraine   . Tobacco abuse     PAST SURGICAL HISTORY:  Past Surgical History:  Procedure Laterality Date  . CORONARY ANGIOPLASTY WITH STENT PLACEMENT  2014   Dr. Nehemiah Massed  . DENTAL RESTORATION/EXTRACTION WITH X-RAY     full upper dental extraction  . TONSILLECTOMY    . WISDOM TOOTH EXTRACTION      SOCIAL HISTORY:  Social History   Tobacco Use  . Smoking status: Current Every Day Smoker    Packs/day: 0.75    Types: Cigarettes  . Smokeless tobacco: Never Used  Substance Use Topics  . Alcohol use: No    FAMILY HISTORY:  Family History  Problem Relation Age of Onset  . Cancer Father        pancreas  . Heart disease Father   . Seizures Sister   . Cerebral palsy Sister   . Leukemia Maternal Grandmother     DRUG ALLERGIES:  Allergies  Allergen Reactions  . Atorvastatin  Other (See Comments)    "makes me feel bad"    REVIEW OF SYSTEMS:   CONSTITUTIONAL: No fever, fatigue or weakness.  EYES: No blurred or double vision.  EARS, NOSE, AND THROAT: No tinnitus or ear pain.  RESPIRATORY: No cough, shortness of breath, wheezing or hemoptysis.  CARDIOVASCULAR: Positive chest pain, orthopnea, edema.  GASTROINTESTINAL: No nausea, vomiting, diarrhea or abdominal pain.  GENITOURINARY: No dysuria, hematuria.  ENDOCRINE: No polyuria, nocturia,  HEMATOLOGY: No anemia, easy bruising or bleeding SKIN: No rash or lesion. MUSCULOSKELETAL: No joint pain or arthritis.   NEUROLOGIC: No tingling, numbness, weakness.  PSYCHIATRY: No anxiety or depression.   MEDICATIONS AT HOME:  Prior to Admission medications   Medication Sig Start Date End Date Taking? Authorizing Provider  fluticasone (FLONASE) 50 MCG/ACT nasal spray Place 2 sprays into both nostrils daily. 08/17/17   Kathrine Haddock, NP  Multiple Vitamin (MULTIVITAMIN) tablet Take 1 tablet by mouth daily.    [provider]  propranolol (INDERAL) 10 MG tablet Take 1 tablet (10 mg total) by mouth 3 (three) times daily. 07/27/18   Guadalupe Maple, MD  simvastatin (ZOCOR) 20 MG tablet Take 1 tablet (20 mg total) by mouth daily. 07/05/17   Kathrine Haddock, NP      PHYSICAL EXAMINATION:  VITAL SIGNS: Blood pressure (!) 131/91, pulse 74, temperature 98.7 F (37.1 C), temperature source Oral, resp. rate 20, height 5\' 6"  (1.676 m), weight 75.3 kg, SpO2 98 %.  GENERAL:  57 y.o.-year-old patient lying in the bed with no acute distress.  EYES: Pupils equal, round, reactive to light and accommodation. No scleral icterus. Extraocular muscles intact.  HEENT: Head atraumatic, normocephalic. Oropharynx and nasopharynx clear.  NECK:  Supple, no jugular venous distention. No thyroid enlargement, no tenderness.  LUNGS: Normal breath sounds bilaterally, no wheezing, rales,rhonchi or crepitation. No use of accessory muscles of  respiration.  CARDIOVASCULAR: S1, S2 normal. No murmurs, rubs, or gallops.  ABDOMEN: Soft, nontender, nondistended. Bowel sounds present. No organomegaly or mass.  EXTREMITIES: No pedal edema, cyanosis, or clubbing.  NEUROLOGIC: Cranial nerves II through XII are intact. Muscle strength 5/5 in all extremities. Sensation intact. Gait not checked.  PSYCHIATRIC: The patient is alert and oriented x 3.  SKIN: No obvious rash, lesion, or ulcer.   LABORATORY PANEL:   CBC Recent Labs  Lab 12/18/18 1520  WBC 6.9  HGB 15.7  HCT 45.6  PLT 181  MCV 96.6  MCH 33.3  MCHC 34.4  RDW 12.0   ------------------------------------------------------------------------------------------------------------------  Chemistries  Recent Labs  Lab 12/18/18 1520  NA 140  K 3.4*  CL 106  CO2 25  GLUCOSE 99  BUN 20  CREATININE 0.94  CALCIUM 8.7*   ------------------------------------------------------------------------------------------------------------------ estimated creatinine clearance is 80.1 mL/min (by C-G formula based on SCr of 0.94 mg/dL). ------------------------------------------------------------------------------------------------------------------ No results for input(s): TSH, T4TOTAL, T3FREE, THYROIDAB in the last 72 hours.  Invalid input(s): FREET3   Coagulation profile No results for input(s): INR, PROTIME in the last 168 hours. ------------------------------------------------------------------------------------------------------------------- No results for input(s): DDIMER in the last 72 hours. -------------------------------------------------------------------------------------------------------------------  Cardiac Enzymes Recent Labs  Lab 12/18/18 1520  TROPONINI 0.28*   ------------------------------------------------------------------------------------------------------------------ Invalid input(s):  POCBNP  ---------------------------------------------------------------------------------------------------------------  Urinalysis    Component Value Date/Time   COLORURINE Yellow 06/18/2013 1107   APPEARANCEUR Clear 06/18/2013 1107   LABSPEC 1.014 06/18/2013 1107   PHURINE 5.0 06/18/2013 1107   GLUCOSEU Negative 06/18/2013 1107   HGBUR Negative 06/18/2013 1107   BILIRUBINUR Negative 06/18/2013 1107   KETONESUR Negative 06/18/2013 1107   PROTEINUR Negative 06/18/2013 1107   NITRITE Negative 06/18/2013 1107   LEUKOCYTESUR Negative 06/18/2013 1107     RADIOLOGY: Dg Chest 2 View  Result Date: 12/18/2018 CLINICAL DATA:  Chest pain. EXAM: CHEST - 2 VIEW COMPARISON:  None. FINDINGS: The heart size and mediastinal contours are within normal limits. Coronary artery stent in place. Both lungs are clear. No effusions. The visualized skeletal structures are unremarkable. IMPRESSION: No acute abnormalities. Electronically Signed   By: Lorriane Shire M.D.   On: 12/18/2018 15:40    EKG: Orders placed or performed during the hospital encounter of 12/18/18  . EKG 12-Lead  . EKG 12-Lead  . ED EKG within 10 minutes  . ED EKG within 10 minutes  . ED EKG  . ED EKG    IMPRESSION AND PLAN: Patient is a 57 year old presenting with chest pain  1.  Non-ST MI Heparin has been initiated which we will continue Start him on aspirin Nitroglycerin Discontinue propanolol start metoprolol Cardiology has been notified Dr. Nehemiah Massed plans to do a cardiac cath tomorrow  2.  Hypertension start metoprolol  3.  Hyperlipidemia patient states that he had body aches related to atorvastatin is willing to try rosuvastatin.  4.  Nicotine abuse smoking cessation provided 4 minutes  spent strongly recommend stop smoking nicotine patch will be started  All the records are reviewed and case discussed with ED provider. Management plans discussed with the patient, family and they are in agreement.  CODE STATUS:  Full code    TOTAL TIME TAKING CARE OF THIS PATIENT: 47minutes.    Dustin Flock M.D on 12/18/2018 at 4:42 PM  Between 7am to 6pm - Pager - [phone]  After 6pm go to [email or web address] - password [email or web address]  [facility] Office  [phone]  CC: Primary care physician; Kathrine Haddock, NP

## 2018-12-18 NOTE — ED Provider Notes (Addendum)
[facility] Emergency Department Provider Note  ____________________________________________   I have reviewed the triage vital signs and the nursing notes. Where available I have reviewed prior notes and, if possible and indicated, outside hospital notes.    HISTORY  Chief Complaint Chest Pain    HPI Brad Cole is a 57 y.o. male  Who presents today sent over by his cardiologist, dr. Nehemiah Massed out of concern for unstable angina.  Patient has a history of stents placed in 2014 he states.  No cardiac events since that time.  Over the last week he has had exertional symptoms with shortness of breath and chest pain which lasted about 1/2-hour even after walking to the mailbox.  This is the same burning discomfort that radiates to his jaw that he is had prior to his stent placement.  He denies any fever or chills.  He is somewhat short of breath but is not pleuritic pain, no personal or family history of PE or DVT.  He has no nausea.  He states that his pain comes and goes he had half hour episode earlier today which went away and then he had another episode which is currently going on since he was in the waiting room.  He rates his pain at a 3 out of 10.  He denies any additional symptoms.  The pain is worse when he walks better when he rests.  No history of GI bleeds.  He does have a history of hypertension, not take aspirin today he was not given aspirin by his cardiologist.  He does have high cholesterol.   Past Medical History:  Diagnosis Date  . CAD (coronary artery disease)   . Hyperlipidemia   . Hypertension   . Migraine   . Tobacco abuse     Patient Active Problem List   Diagnosis Date Noted  . Hypertension 07/27/2018  . Abscess of back 07/27/2018  . Tobacco abuse 09/26/2017  . Serous otitis media 08/17/2017  . CAD (coronary artery disease) 07/23/2016  . Essential hypertension, benign 10/09/2015  . Rash of hands 10/09/2015  . Tremor 07/11/2015   . Migraine   . Hyperlipidemia     Past Surgical History:  Procedure Laterality Date  . CORONARY ANGIOPLASTY WITH STENT PLACEMENT  2014   Dr. Nehemiah Massed  . DENTAL RESTORATION/EXTRACTION WITH X-RAY     full upper dental extraction  . TONSILLECTOMY    . WISDOM TOOTH EXTRACTION      Prior to Admission medications   Medication Sig Start Date End Date Taking? Authorizing Provider  fluticasone (FLONASE) 50 MCG/ACT nasal spray Place 2 sprays into both nostrils daily. 08/17/17   Kathrine Haddock, NP  Multiple Vitamin (MULTIVITAMIN) tablet Take 1 tablet by mouth daily.    [provider]  propranolol (INDERAL) 10 MG tablet Take 1 tablet (10 mg total) by mouth 3 (three) times daily. 07/27/18   Guadalupe Maple, MD  simvastatin (ZOCOR) 20 MG tablet Take 1 tablet (20 mg total) by mouth daily. 07/05/17   Kathrine Haddock, NP    Allergies Atorvastatin  Family History  Problem Relation Age of Onset  . Cancer Father        pancreas  . Heart disease Father   . Seizures Sister   . Cerebral palsy Sister   . Leukemia Maternal Grandmother     Social History Social History   Tobacco Use  . Smoking status: Current Every Day Smoker    Packs/day: 0.75    Types: Cigarettes  .  Smokeless tobacco: Never Used  Substance Use Topics  . Alcohol use: No  . Drug use: No    Review of Systems Constitutional: No fever/chills Eyes: No visual changes. ENT: No sore throat. No stiff neck no neck pain Cardiovascular: + chest pain. Respiratory: + shortness of breath. Gastrointestinal:   no vomiting.  No diarrhea.  No constipation. Genitourinary: Negative for dysuria. Musculoskeletal: Negative lower extremity swelling Skin: Negative for rash. Neurological: Negative for severe headaches, focal weakness or numbness.   ____________________________________________   PHYSICAL EXAM:  VITAL SIGNS: ED Triage Vitals  Enc Vitals Group     BP 12/18/18 1513 (!) 131/91     Pulse Rate 12/18/18 1513 74      Resp 12/18/18 1513 20     Temp 12/18/18 1513 98.7 F (37.1 C)     Temp Source 12/18/18 1513 Oral     SpO2 12/18/18 1513 98 %     Weight 12/18/18 1513 166 lb (75.3 kg)     Height 12/18/18 1513 5\' 6"  (1.676 m)     Head Circumference --      Peak Flow --      Pain Score 12/18/18 1517 5     Pain Loc --      Pain Edu? --      Excl. in [place]? --     Constitutional: Alert and oriented. Well appearing and in no acute distress. Eyes: Conjunctivae are normal Head: Atraumatic HEENT: No congestion/rhinnorhea. Mucous membranes are moist.  Oropharynx non-erythematous Neck:   Nontender with no meningismus, no masses, no stridor Cardiovascular: Normal rate, regular rhythm. Grossly normal heart sounds.  Good peripheral circulation. Respiratory: Normal respiratory effort.  No retractions. Lungs CTAB. Abdominal: Soft and nontender. No distention. No guarding no rebound Back:  There is no focal tenderness or step off.  there is no midline tenderness there are no lesions noted. there is no CVA tenderness Musculoskeletal: No lower extremity tenderness, no upper extremity tenderness. No joint effusions, no DVT signs strong distal pulses no edema Neurologic:  Normal speech and language. No gross focal neurologic deficits are appreciated.  Skin:  Skin is warm, dry and intact. No rash noted. Psychiatric: Mood and affect are normal. Speech and behavior are normal.  ____________________________________________   LABS (all labs ordered are listed, but only abnormal results are displayed)  Labs Reviewed  BASIC METABOLIC PANEL - Abnormal; Notable for the following components:      Result Value   Potassium 3.4 (*)    Calcium 8.7 (*)    All other components within normal limits  TROPONIN I - Abnormal; Notable for the following components:   Troponin I 0.28 (*)    All other components within normal limits  CBC  PROTIME-INR  APTT    Pertinent labs  results that were available during my care of the patient  were reviewed by me and considered in my medical decision making (see chart for details). ____________________________________________  EKG  I personally interpreted any EKGs ordered by me or triage   2 EKGs were performed on this patient, one in the waiting room 1 since he came back.  The first EKG showed a rate of 79 with flipped T waves in 3 and aVF but no ST elevation, normal axis, repeat EKG, at 1610, shows flipped T waves in inferior leads no ST elevation or depression,  ____________________________________________  RADIOLOGY  Pertinent labs & imaging results that were available during my care of the patient were reviewed by me and considered  in my medical decision making (see chart for details). If possible, patient and/or family made aware of any abnormal findings.  Dg Chest 2 View  Result Date: 12/18/2018 CLINICAL DATA:  Chest pain. EXAM: CHEST - 2 VIEW COMPARISON:  None. FINDINGS: The heart size and mediastinal contours are within normal limits. Coronary artery stent in place. Both lungs are clear. No effusions. The visualized skeletal structures are unremarkable. IMPRESSION: No acute abnormalities. Electronically Signed   By: Lorriane Shire M.D.   On: 12/18/2018 15:40   ____________________________________________    PROCEDURES  Procedure(s) performed: None  Procedures  Critical Care performed: CRITICAL CARE Performed by: Schuyler Amor   Total critical care time: 38 minutes  Critical care time was exclusive of separately billable procedures and treating other patients.  Critical care was necessary to treat or prevent imminent or life-threatening deterioration.  Critical care was time spent personally by me on the following activities: development of treatment plan with patient and/or surrogate as well as nursing, discussions with consultants, evaluation of patient's response to treatment, examination of patient, obtaining history from patient or surrogate, ordering  and performing treatments and interventions, ordering and review of laboratory studies, ordering and review of radiographic studies, pulse oximetry and re-evaluation of patient's condition.     ____________________________________________   INITIAL IMPRESSION / ASSESSMENT AND PLAN / ED COURSE  Pertinent labs & imaging results that were available during my care of the patient were reviewed by me and considered in my medical decision making (see chart for details).  Patient here with signs and symptoms of unstable angina does have multiple risk factors including ongoing tobacco use and prior CAD.  His symptoms are his anginal equivalent.  He is in minimal discomfort at this time.  We will give nitro and aspirin to see if that helps.  I have ordered a heparin drip.  I paged his cardiologist.  Patient will be admitted to the hospital.  Does not be criteria for STEMI.  I do not think this represents dissection or PE.  Initial troponin is borderline.  ----------------------------------------- 4:34 PM on 12/18/2018 -----------------------------------------  Awaiting callback from cardiology, patient is pain-free at this time.  ----------------------------------------- 4:40 PM on 12/18/2018 -----------------------------------------  Dr,. Nehemiah Massed called back.  Appreciate the consult.  He agreed with heparin and admission.  He  agrees with hospitalist admission.  Dr. Posey Pronto will admit patient.     ____________________________________________   FINAL CLINICAL IMPRESSION(S) / ED DIAGNOSES  Final diagnoses:  Chest pain, unspecified type  Troponin I above reference range      This chart was dictated using voice recognition software.  Despite best efforts to proofread,  errors can occur which can change meaning.     Schuyler Amor, MD 12/18/18 1622    Schuyler Amor, MD 12/18/18 1635    Schuyler Amor, MD 12/18/18 [id number]    Schuyler Amor, MD 12/18/18 [phone]

## 2018-12-18 NOTE — ED Notes (Signed)
EKG given to EDP McShane in person.

## 2018-12-18 NOTE — ED Notes (Signed)
Wife at bedside.

## 2018-12-18 NOTE — ED Triage Notes (Signed)
C/o chest pain X1 week and had appointment with cardiologist today at 2:pm. Patient c/o central chest pain that radiated down his Left arm between 12:30pm-1:00pm.Patients cardiologist sent him here

## 2018-12-18 NOTE — ED Notes (Signed)
Pt denies needs.  

## 2018-12-18 NOTE — ED Notes (Signed)
NT completing EKG now.

## 2018-12-19 ENCOUNTER — Encounter
Admission: EM | Disposition: A | Discharge status: Home / Self Care | Payer: Self-pay | Source: Home / Self Care | Attending: Internal Medicine

## 2018-12-19 ENCOUNTER — Inpatient Hospital Stay: Payer: BLUE CROSS/BLUE SHIELD

## 2018-12-19 DIAGNOSIS — I2510 Atherosclerotic heart disease of native coronary artery without angina pectoris: Secondary | ICD-10-CM

## 2018-12-19 DIAGNOSIS — I214 Non-ST elevation (NSTEMI) myocardial infarction: Secondary | ICD-10-CM

## 2018-12-19 HISTORY — PX: LEFT HEART CATH AND CORONARY ANGIOGRAPHY: CATH118249

## 2018-12-19 HISTORY — PX: CORONARY STENT INTERVENTION: CATH118234

## 2018-12-19 LAB — BASIC METABOLIC PANEL
ANION GAP: 9 (ref 5–15)
BUN: 17 mg/dL (ref 6–20)
CO2: 22 mmol/L (ref 22–32)
Calcium: 8.3 mg/dL — ABNORMAL LOW (ref 8.9–10.3)
Chloride: 108 mmol/L (ref 98–111)
Creatinine, Ser: 0.75 mg/dL (ref 0.61–1.24)
GFR calc Af Amer: >60 mL/min (ref >60)
GFR calc non Af Amer: >60 mL/min (ref >60)
GLUCOSE: 84 mg/dL (ref 70–99)
Potassium: 3.9 mmol/L (ref 3.5–5.1)
Sodium: 139 mmol/L (ref 135–145)

## 2018-12-19 LAB — TROPONIN I
Troponin I: 0.25 ng/mL (ref <0.03)
Troponin I: 0.17 ng/mL (ref <0.03)
Troponin I: 0.15 ng/mL (ref <0.03)

## 2018-12-19 LAB — CBC
HCT: 44.9 % (ref 39.0–52.0)
Hemoglobin: 15.1 g/dL (ref 13.0–17.0)
MCH: 32.5 pg (ref 26.0–34.0)
MCHC: 33.6 g/dL (ref 30.0–36.0)
MCV: 96.8 fL (ref 80.0–100.0)
Platelets: 164 10*3/uL (ref 150–400)
RBC: 4.64 MIL/uL (ref 4.22–5.81)
RDW: 12.2 % (ref 11.5–15.5)
WBC: 6.9 10*3/uL (ref 4.0–10.5)
nRBC: 0 % (ref 0.0–0.2)

## 2018-12-19 LAB — HEPARIN LEVEL (UNFRACTIONATED)
HEPARIN UNFRACTIONATED: 0.43 [IU]/mL (ref 0.30–0.70)
Heparin Unfractionated: 0.71 IU/mL — ABNORMAL HIGH (ref 0.30–0.70)

## 2018-12-19 LAB — TSH: TSH: 1.614 u[IU]/mL (ref 0.350–4.500)

## 2018-12-19 LAB — HEMOGLOBIN A1C
Hgb A1c MFr Bld: 5.5 % (ref 4.8–5.6)
Mean Plasma Glucose: 111.15 mg/dL

## 2018-12-19 SURGERY — LEFT HEART CATH AND CORONARY ANGIOGRAPHY
Anesthesia: Moderate Sedation

## 2018-12-19 MED ORDER — VERAPAMIL HCL 2.5 MG/ML IV SOLN
INTRAVENOUS | Status: DC | PRN
  Administered 2018-12-19: 2.5 mg via INTRAVENOUS

## 2018-12-19 MED ORDER — TICAGRELOR 90 MG PO TABS
ORAL_TABLET | ORAL | Status: DC | PRN
  Administered 2018-12-19: 180 mg via ORAL

## 2018-12-19 MED ORDER — FENTANYL CITRATE (PF) 100 MCG/2ML IJ SOLN
INTRAMUSCULAR | Status: AC
  Filled 2018-12-19: qty 2

## 2018-12-19 MED ORDER — TICAGRELOR 90 MG PO TABS
90.0000 mg | ORAL_TABLET | Freq: Two times a day (BID) | ORAL | Status: DC
  Administered 2018-12-19 – 2018-12-20 (×2): 90 mg via ORAL
  Filled 2018-12-19 (×2): qty 1

## 2018-12-19 MED ORDER — HEPARIN (PORCINE) IN NACL 1000-0.9 UT/500ML-% IV SOLN
INTRAVENOUS | Status: AC
  Filled 2018-12-19: qty 1000

## 2018-12-19 MED ORDER — ASPIRIN 81 MG PO CHEW
81.0000 mg | CHEWABLE_TABLET | Freq: Every day | ORAL | Status: DC
  Administered 2018-12-20: 81 mg via ORAL
  Filled 2018-12-19: qty 1

## 2018-12-19 MED ORDER — IOPAMIDOL (ISOVUE-300) INJECTION 61%
INTRAVENOUS | Status: DC | PRN
  Administered 2018-12-19: 100 mL via INTRA_ARTERIAL

## 2018-12-19 MED ORDER — MIDAZOLAM HCL 2 MG/2ML IJ SOLN
INTRAMUSCULAR | Status: DC | PRN
  Administered 2018-12-19: 1 mg via INTRAVENOUS

## 2018-12-19 MED ORDER — SODIUM CHLORIDE 0.9 % WEIGHT BASED INFUSION
3.0000 mL/kg/h | INTRAVENOUS | Status: DC
  Administered 2018-12-19: 3 mL/kg/h via INTRAVENOUS

## 2018-12-19 MED ORDER — NITROGLYCERIN 5 MG/ML IV SOLN
INTRAVENOUS | Status: AC
  Filled 2018-12-19: qty 10

## 2018-12-19 MED ORDER — VERAPAMIL HCL 2.5 MG/ML IV SOLN
INTRAVENOUS | Status: AC
  Filled 2018-12-19: qty 2

## 2018-12-19 MED ORDER — SODIUM CHLORIDE 0.9 % IV SOLN: 250.0000 mL | INTRAVENOUS | Status: DC | PRN

## 2018-12-19 MED ORDER — FENTANYL CITRATE (PF) 100 MCG/2ML IJ SOLN
INTRAMUSCULAR | Status: DC | PRN
  Administered 2018-12-19: 25 ug via INTRAVENOUS

## 2018-12-19 MED ORDER — SODIUM CHLORIDE 0.9% FLUSH: 3.0000 mL | Freq: Two times a day (BID) | INTRAVENOUS | Status: DC

## 2018-12-19 MED ORDER — SODIUM CHLORIDE 0.9% FLUSH: 3.0000 mL | INTRAVENOUS | Status: DC | PRN

## 2018-12-19 MED ORDER — NITROGLYCERIN 1 MG/10 ML FOR IR/CATH LAB
INTRA_ARTERIAL | Status: DC | PRN
  Administered 2018-12-19: 100 ug via INTRACORONARY
  Administered 2018-12-19: 200 ug via INTRACORONARY

## 2018-12-19 MED ORDER — SODIUM CHLORIDE 0.9 % IV SOLN
INTRAVENOUS | Status: AC
  Administered 2018-12-19: 14:00:00 via INTRAVENOUS

## 2018-12-19 MED ORDER — HEPARIN SODIUM (PORCINE) 1000 UNIT/ML IJ SOLN
INTRAMUSCULAR | Status: DC | PRN
  Administered 2018-12-19: 3700 [IU] via INTRAVENOUS

## 2018-12-19 MED ORDER — SODIUM CHLORIDE 0.9 % WEIGHT BASED INFUSION: 1.0000 mL/kg/h | INTRAVENOUS | Status: DC

## 2018-12-19 MED ORDER — HEPARIN SODIUM (PORCINE) 1000 UNIT/ML IJ SOLN
INTRAMUSCULAR | Status: DC | PRN
  Administered 2018-12-19: 2000 [IU] via INTRAVENOUS
  Administered 2018-12-19: 4000 [IU] via INTRAVENOUS

## 2018-12-19 MED ORDER — TICAGRELOR 90 MG PO TABS
ORAL_TABLET | ORAL | Status: AC
  Filled 2018-12-19: qty 2

## 2018-12-19 MED ORDER — HEPARIN SODIUM (PORCINE) 1000 UNIT/ML IJ SOLN
INTRAMUSCULAR | Status: AC
  Filled 2018-12-19: qty 1

## 2018-12-19 MED ORDER — ASPIRIN 81 MG PO CHEW
CHEWABLE_TABLET | ORAL | Status: DC | PRN
  Administered 2018-12-19: 324 mg via ORAL

## 2018-12-19 MED ORDER — ENOXAPARIN SODIUM 40 MG/0.4ML ~~LOC~~ SOLN: 40.0000 mg | SUBCUTANEOUS | Status: DC

## 2018-12-19 MED ORDER — ASPIRIN 81 MG PO CHEW: 81.0000 mg | CHEWABLE_TABLET | ORAL | Status: DC

## 2018-12-19 MED ORDER — IOPAMIDOL (ISOVUE-300) INJECTION 61%
INTRAVENOUS | Status: DC | PRN
  Administered 2018-12-19: 55 mL via INTRA_ARTERIAL

## 2018-12-19 MED ORDER — ASPIRIN 81 MG PO CHEW
CHEWABLE_TABLET | ORAL | Status: AC
  Filled 2018-12-19: qty 4

## 2018-12-19 MED ORDER — HYDRALAZINE HCL 20 MG/ML IJ SOLN: 5.0000 mg | INTRAMUSCULAR | Status: AC | PRN

## 2018-12-19 MED ORDER — LABETALOL HCL 5 MG/ML IV SOLN: 10.0000 mg | INTRAVENOUS | Status: AC | PRN

## 2018-12-19 MED ORDER — MIDAZOLAM HCL 2 MG/2ML IJ SOLN
INTRAMUSCULAR | Status: AC
  Filled 2018-12-19: qty 2

## 2018-12-19 SURGICAL SUPPLY — 19 items
BALLN TREK RX 2.5X12 (BALLOONS) ×3
BALLN ~~LOC~~ EUPHORA RX 3.25X20 (BALLOONS) ×3
BALLN ~~LOC~~ TREK RX 3.0X8 (BALLOONS) ×3
BALLOON TREK RX 2.5X12 (BALLOONS) ×1 IMPLANT
BALLOON ~~LOC~~ EUPHORA RX 3.25X20 (BALLOONS) ×1 IMPLANT
BALLOON ~~LOC~~ TREK RX 3.0X8 (BALLOONS) ×1 IMPLANT
CATH INFINITI 5FR ANG PIGTAIL (CATHETERS) ×3 IMPLANT
CATH LAUNCHER 6FR JR4 (CATHETERS) ×3 IMPLANT
CATH OPTITORQUE JACKY 4.0 5F (CATHETERS) ×3 IMPLANT
DEVICE INFLAT 30 PLUS (MISCELLANEOUS) ×3 IMPLANT
DEVICE RAD TR BAND REGULAR (VASCULAR PRODUCTS) ×3 IMPLANT
GLIDESHEATH SLEND SS 6F .021 (SHEATH) ×3 IMPLANT
KIT MANI 3VAL PERCEP (MISCELLANEOUS) ×3 IMPLANT
PACK CARDIAC CATH (CUSTOM PROCEDURE TRAY) ×3 IMPLANT
STENT RESOLUTE ONYX 2.75X15 (Permanent Stent) ×3 IMPLANT
STENT RESOLUTE ONYX3.0X38 (Permanent Stent) ×3 IMPLANT
WIRE GUIDERIGHT .035X150 (WIRE) ×3 IMPLANT
WIRE ROSEN-J .035X260CM (WIRE) ×3 IMPLANT
WIRE RUNTHROUGH .014X180CM (WIRE) ×3 IMPLANT

## 2018-12-19 NOTE — Consult Note (Signed)
[facility] Consultation Note  Patient ID: Brad Cole, MRN: [id number], DOB/AGE: 55/24/1962 57 y.o. Admit date: 12/18/2018   Date of Consult: 12/19/2018 Primary Physician: Guadalupe Maple, MD Primary Cardiologist: Nehemiah Massed  Chief Complaint:  Chief Complaint  Patient presents with  . Chest Pain   Reason for Consult: Unstable angina  HPI: 57 y.o. male with known coronary disease status post previous PCI and stent placement left anterior descending artery tobacco abuse hypertension hyperlipidemia.  The patient has not been compliant with his medication management over the last several years with medications for risk reduction cardiovascular disease.  The patient now has had severe progression of shortness of breath chest pain centrally located radiating into his back occurring with very minimal activity.  When seen in the office yesterday the patient had been having difficulty with chest pain for over 3 hours.  EKG showed normal sinus rhythm with new T wave inversions in the inferior leads and now a troponin of 0.2 consistent with non-ST elevation myocardial infarction.  Patient has had significant resolution of chest discomfort and symptoms with appropriate medication management.  We have discussed at length further treatment options including the possibility of cardiac catheterization.  Past Medical History:  Diagnosis Date  . CAD (coronary artery disease)   . Hyperlipidemia   . Hypertension   . Migraine   . Tobacco abuse       Surgical History:  Past Surgical History:  Procedure Laterality Date  . CORONARY ANGIOPLASTY WITH STENT PLACEMENT  2014   Dr. Nehemiah Massed  . DENTAL RESTORATION/EXTRACTION WITH X-RAY     full upper dental extraction  . TONSILLECTOMY    . WISDOM TOOTH EXTRACTION       Home Meds: Prior to Admission medications   Medication Sig Start Date End Date Taking? Authorizing Provider  Multiple Vitamin (MULTIVITAMIN) tablet Take 1 tablet by  mouth daily.   Yes [provider]  propranolol (INDERAL) 10 MG tablet Take 1 tablet (10 mg total) by mouth 3 (three) times daily. 07/27/18  Yes Guadalupe Maple, MD    Inpatient Medications:  . aspirin  324 mg Oral Once  . metoprolol tartrate  25 mg Oral BID  . nicotine  21 mg Transdermal Daily  . nitroGLYCERIN  0.5 inch Topical Q6H  . rosuvastatin  20 mg Oral q1800   . sodium chloride 75 mL/hr at 12/19/18 0013  . heparin 1,000 Units/hr (12/19/18 0115)    Allergies:  Allergies  Allergen Reactions  . Atorvastatin Other (See Comments)    "makes me feel bad"    Social History   Socioeconomic History  . Marital status: Married    Spouse name: Not on file  . Number of children: Not on file  . Years of education: Not on file  . Highest education level: Not on file  Occupational History  . Not on file  Social Needs  . Financial resource strain: Not on file  . Food insecurity:    Worry: Not on file    Inability: Not on file  . Transportation needs:    Medical: Not on file    Non-medical: Not on file  Tobacco Use  . Smoking status: Current Every Day Smoker    Packs/day: 0.75    Types: Cigarettes  . Smokeless tobacco: Never Used  Substance and Sexual Activity  . Alcohol use: No  . Drug use: No  . Sexual activity: Yes  Lifestyle  . Physical activity:    Days per week:  Not on file    Minutes per session: Not on file  . Stress: Not on file  Relationships  . Social connections:    Talks on phone: Not on file    Gets together: Not on file    Attends religious service: Not on file    Active member of club or organization: Not on file    Attends meetings of clubs or organizations: Not on file    Relationship status: Not on file  . Intimate partner violence:    Fear of current or ex partner: Not on file    Emotionally abused: Not on file    Physically abused: Not on file    Forced sexual activity: Not on file  Other Topics Concern  . Not on file  Social  History Narrative  . Not on file     Family History  Problem Relation Age of Onset  . Cancer Father        pancreas  . Heart disease Father   . Seizures Sister   . Cerebral palsy Sister   . Leukemia Maternal Grandmother      Review of Systems Positive for chest pain shortness of breath Negative for: General:  chills, fever, night sweats or weight changes.  Cardiovascular: PND orthopnea syncope dizziness  Dermatological skin lesions rashes Respiratory: Cough congestion Urologic: Frequent urination urination at night and hematuria Abdominal: negative for nausea, vomiting, diarrhea, bright red blood per rectum, melena, or hematemesis Neurologic: negative for visual changes, and/or hearing changes  All other systems reviewed and are otherwise negative except as noted above.  Labs: Recent Labs    12/18/18 1520 12/18/18 2358 12/19/18 0542  TROPONINI 0.28* 0.25* 0.17*   Lab Results  Component Value Date   WBC 6.9 12/19/2018   HGB 15.1 12/19/2018   HCT 44.9 12/19/2018   MCV 96.8 12/19/2018   PLT 164 12/19/2018    Recent Labs  Lab 12/19/18 0542  NA 139  K 3.9  CL 108  CO2 22  BUN 17  CREATININE 0.75  CALCIUM 8.3*  GLUCOSE 84   Lab Results  Component Value Date   CHOL 158 08/17/2017   HDL 42 07/05/2017   LDLCALC 202 (H) 07/05/2017   TRIG 73 08/17/2017   No results found for: DDIMER  Radiology/Studies:  Dg Chest 2 View  Result Date: 12/18/2018 CLINICAL DATA:  Chest pain. EXAM: CHEST - 2 VIEW COMPARISON:  None. FINDINGS: The heart size and mediastinal contours are within normal limits. Coronary artery stent in place. Both lungs are clear. No effusions. The visualized skeletal structures are unremarkable. IMPRESSION: No acute abnormalities. Electronically Signed   By: Lorriane Shire M.D.   On: 12/18/2018 15:40    EKG: Normal sinus rhythm with inferior T wave inversion  Weights: Filed Weights   12/18/18 1513  Weight: 75.3 kg     Physical Exam: Blood  pressure 105/78, pulse 60, temperature 98 F (36.7 C), temperature source Oral, resp. rate 19, height 5\' 6"  (1.676 m), weight 75.3 kg, SpO2 97 %. Body mass index is 26.79 kg/m. General: Well developed, well nourished, in no acute distress. Head eyes ears nose throat: Normocephalic, atraumatic, sclera non-icteric, no xanthomas, nares are without discharge. No apparent thyromegaly and/or mass  Lungs: Normal respiratory effort.  no wheezes, no rales, no rhonchi.  Heart: RRR with normal S1 S2. no murmur gallop, no rub, PMI is normal size and placement, carotid upstroke normal without bruit, jugular venous pressure is normal Abdomen: Soft, non-tender, non-distended with  normoactive bowel sounds. No hepatomegaly. No rebound/guarding. No obvious abdominal masses. Abdominal aorta is normal size without bruit Extremities: No edema. no cyanosis, no clubbing, no ulcers  Peripheral : 2+ bilateral upper extremity pulses, 2+ bilateral femoral pulses, 2+ bilateral dorsal pedal pulse Neuro: Alert and oriented. No facial asymmetry. No focal deficit. Moves all extremities spontaneously. Musculoskeletal: Normal muscle tone without kyphosis Psych:  Responds to questions appropriately with a normal affect.    Assessment: 57 year old male with known cardiovascular disease hypertension hyperlipidemia having unstable angina type symptoms and now issues with elevated troponin new T wave inversion consistent with possible non-ST elevation myocardial infarction  Plan: 1.  Continue heparin for further risk reduction cardiovascular event 2.  Aspirin for myocardial infarction 3.  Beta-blocker and nitrates 4.  Proceed to cardiac catheterization to assess coronary anatomy and further treatment thereof is necessary.  Patient understands the risk and benefits of cardiac catheterization.  This includes the possibility of death stroke heart attack infection bleeding or blood clot.  He is at low risk for conscious  sedation  Signed, Corey Skains M.D. [facility] 12/19/2018, 7:42 AM

## 2018-12-19 NOTE — Consult Note (Signed)
ANTICOAGULATION CONSULT NOTE - Initial Consult  Pharmacy Consult for Heparin Indication: chest pain/ACS  Allergies  Allergen Reactions  . Atorvastatin Other (See Comments)    "makes me feel bad"    Patient Measurements: Height: 5\' 6"  (167.6 cm) Weight: 166 lb (75.3 kg) IBW/kg (Calculated) : 63.8 Heparin Dosing Weight: 75.3kg  Vital Signs: Temp: 98 F (36.7 C) (12/31 0730) Temp Source: Oral (12/31 0730) BP: 105/78 (12/31 0730) Pulse Rate: 60 (12/31 0730)  Labs: Recent Labs    12/18/18 1520 12/18/18 1621 12/18/18 2358 12/19/18 0542 12/19/18 0702  HGB 15.7  --   --  15.1  --   HCT 45.6  --   --  44.9  --   PLT 181  --   --  164  --   APTT  --  31  --   --   --   LABPROT  --  12.4  --   --   --   INR  --  0.93  --   --   --   HEPARINUNFRC  --   --  0.71*  --  0.43  CREATININE 0.94  --   --  0.75  --   TROPONINI 0.28*  --  0.25* 0.17*  --     Estimated Creatinine Clearance: 94.1 mL/min (by C-G formula based on SCr of 0.75 mg/dL).   Medical History: Past Medical History:  Diagnosis Date  . CAD (coronary artery disease)   . Hyperlipidemia   . Hypertension   . Migraine   . Tobacco abuse     Medications:  No pta anticoagulation  Assessment: Pharmacy has been consulted for heparin dosing for ACS/STEMI  Goal of Therapy:  Heparin level 0.3-0.7 units/ml Monitor platelets by anticoagulation protocol: Yes   Plan:  12/31 @ 0702 HL 0.43. Level is therapeutic. Will continue current regimen and recheck heparin level in 6 hours. Will continuing monitoring CBC's.  Oswald Hillock, PharmD, BCPS Clinical Pharmacist 12/19/2018 7:41 AM

## 2018-12-19 NOTE — Consult Note (Signed)
ANTICOAGULATION CONSULT NOTE - Initial Consult  Pharmacy Consult for Heparin Indication: chest pain/ACS  Allergies  Allergen Reactions  . Atorvastatin Other (See Comments)    "makes me feel bad"    Patient Measurements: Height: 5\' 6"  (167.6 cm) Weight: 166 lb (75.3 kg) IBW/kg (Calculated) : 63.8 Heparin Dosing Weight: 75.3kg  Vital Signs: Temp: 97.9 F (36.6 C) (12/30 2353) Temp Source: Oral (12/30 2353) BP: 122/83 (12/30 2353) Pulse Rate: 60 (12/30 2353)  Labs: Recent Labs    12/18/18 1520 12/18/18 1621 12/18/18 2358  HGB 15.7  --   --   HCT 45.6  --   --   PLT 181  --   --   APTT  --  31  --   LABPROT  --  12.4  --   INR  --  0.93  --   HEPARINUNFRC  --   --  0.71*  CREATININE 0.94  --   --   TROPONINI 0.28*  --   --     Estimated Creatinine Clearance: 80.1 mL/min (by C-G formula based on SCr of 0.94 mg/dL).   Medical History: Past Medical History:  Diagnosis Date  . CAD (coronary artery disease)   . Hyperlipidemia   . Hypertension   . Migraine   . Tobacco abuse     Medications:  No pta anticoagulation  Assessment: Pharmacy has been consulted for heparin dosing for ACS/STEMI  Goal of Therapy:  Heparin level 0.3-0.7 units/ml Monitor platelets by anticoagulation protocol: Yes   Plan:  12/30 @ 2358 HL 0.71 borderline supratherapeutic. Will decrease rate to 1000 units/hr and will recheck HL @ 0700, will continuing monitoring CBC's.  Tobie Lords, PharmD, BCPS Clinical Pharmacist 12/19/2018 1:02 AM

## 2018-12-19 NOTE — Progress Notes (Signed)
[facility] [place] NAME: Brad Cole    MR#:  [id number]  DATE OF BIRTH:  July 06, 1961  SUBJECTIVE:   Patient presented to the emergency room due to chest pain.  Denies any chest pain overnight.  REVIEW OF SYSTEMS:    Review of Systems  Constitutional: Negative for fever, chills weight loss HENT: Negative for ear pain, nosebleeds, congestion, facial swelling, rhinorrhea, neck pain, neck stiffness and ear discharge.   Respiratory: Negative for cough, shortness of breath, wheezing  Cardiovascular: Negative for chest pain, palpitations and leg swelling.  Gastrointestinal: Negative for heartburn, abdominal pain, vomiting, diarrhea or consitpation Genitourinary: Negative for dysuria, urgency, frequency, hematuria Musculoskeletal: Negative for back pain or joint pain Neurological: Negative for dizziness, seizures, syncope, focal weakness,  numbness and headaches.  Hematological: Does not bruise/bleed easily.  Psychiatric/Behavioral: Negative for hallucinations, confusion, dysphoric mood    Tolerating Diet: N.p.o.      DRUG ALLERGIES:   Allergies  Allergen Reactions  . Atorvastatin Other (See Comments)    "makes me feel bad"    VITALS:  Blood pressure 105/78, pulse 60, temperature 98 F (36.7 C), temperature source Oral, resp. rate 19, height 5\' 6"  (1.676 m), weight 75.3 kg, SpO2 97 %.  PHYSICAL EXAMINATION:  Constitutional: Appears well-developed and well-nourished. No distress. HENT: Normocephalic. Marland Kitchen Oropharynx is clear and moist.  Eyes: Conjunctivae and EOM are normal. PERRLA, no scleral icterus.  Neck: Normal ROM. Neck supple. No JVD. No tracheal deviation. CVS: RRR, S1/S2 +, no murmurs, no gallops, no carotid bruit.  Pulmonary: Effort and breath sounds normal, no stridor, rhonchi, wheezes, rales.  Abdominal: Soft. BS +,  no distension, tenderness, rebound or guarding.  Musculoskeletal: Normal range of motion. No edema and  no tenderness.  Neuro: Alert. CN 2-12 grossly intact. No focal deficits. Skin: Skin is warm and dry. No rash noted. Psychiatric: Normal mood and affect.      LABORATORY PANEL:   CBC Recent Labs  Lab 12/19/18 0542  WBC 6.9  HGB 15.1  HCT 44.9  PLT 164   ------------------------------------------------------------------------------------------------------------------  Chemistries  Recent Labs  Lab 12/19/18 0542  NA 139  K 3.9  CL 108  CO2 22  GLUCOSE 84  BUN 17  CREATININE 0.75  CALCIUM 8.3*   ------------------------------------------------------------------------------------------------------------------  Cardiac Enzymes Recent Labs  Lab 12/18/18 1520 12/18/18 2358 12/19/18 0542  TROPONINI 0.28* 0.25* 0.17*   ------------------------------------------------------------------------------------------------------------------  RADIOLOGY:  Dg Chest 2 View  Result Date: 12/18/2018 CLINICAL DATA:  Chest pain. EXAM: CHEST - 2 VIEW COMPARISON:  None. FINDINGS: The heart size and mediastinal contours are within normal limits. Coronary artery stent in place. Both lungs are clear. No effusions. The visualized skeletal structures are unremarkable. IMPRESSION: No acute abnormalities. Electronically Signed   By: Lorriane Shire M.D.   On: 12/18/2018 15:40     ASSESSMENT AND PLAN:   57 year old male with CAD status post PCI and stent placement to left anterior descending artery, tobacco dependence and essential hypertension who presented to the emergency room due to chest pain.  1 Non-ST elevation MI: EKG with new T wave inversions in the inferior leads and troponin max of 0.2 Plan for cardiac catheterization today with [facility] cardiology. Continue aspirin, metoprolol and statin Further management after cardiac cath  2.Tobacco dependence: Patient is encouraged to quit smoking. Counseling was provided for 4 minutes.   3.  Essential hypertension: Continue metoprolol and  adjust medications  4.  Hyperlipidemia: Continue statin Open lipid panel  Management plans discussed with the patient and he is in agreement.  CODE STATUS: full  TOTAL TIME TAKING CARE OF THIS PATIENT: 30 minutes.     POSSIBLE D/C tomorrow, DEPENDING ON CLINICAL CONDITION.   Brad Cole M.D on 12/19/2018 at 10:40 AM  Between 7am to 6pm - Pager - [phone] After 6pm go to [email or web address] - password [email or web address] [facility]  Office  [phone]  CC: Primary care physician; Guadalupe Maple, MD  Note: This dictation was prepared with Dragon dictation along with smaller phrase technology. Any transcriptional errors that result from this process are unintentional.

## 2018-12-19 NOTE — Progress Notes (Signed)
[facility] Encounter Note  Patient: Brad Cole / Admit Date: 12/18/2018 / Date of Encounter: 12/19/2018, 12:52 PM   Subjective: Patient feeling much better overnight with no further ethicist of chest pain.  Troponin elevation consistent with minimal non-ST elevation myocardial infarction Cardiac catheterization showing normal LV systolic function with ejection fraction of 55% Patent stent in the left anterior descending artery Critical proximal right coronary artery stenosis of 95%  Review of Systems: Positive for: None Negative for: Vision change, hearing change, syncope, dizziness, nausea, vomiting,diarrhea, bloody stool, stomach pain, cough, congestion, diaphoresis, urinary frequency, urinary pain,skin lesions, skin rashes Others previously listed  Objective: Telemetry: Normal sinus rhythm Physical Exam: Blood pressure (!) 131/97, pulse 69, temperature 98.1 F (36.7 C), temperature source Oral, resp. rate 16, height 5\' 6"  (1.676 m), weight 75.3 kg, SpO2 99 %. Body mass index is 26.79 kg/m. General: Well developed, well nourished, in no acute distress. Head: Normocephalic, atraumatic, sclera non-icteric, no xanthomas, nares are without discharge. Neck: No apparent masses Lungs: Normal respirations with no wheezes, no rhonchi, no rales , no crackles   Heart: Regular rate and rhythm, normal S1 S2, no murmur, no rub, no gallop, PMI is normal size and placement, carotid upstroke normal without bruit, jugular venous pressure normal Abdomen: Soft, non-tender, non-distended with normoactive bowel sounds. No hepatosplenomegaly. Abdominal aorta is normal size without bruit Extremities: No edema, no clubbing, no cyanosis, no ulcers,  Peripheral: 2+ radial, 2+ femoral, 2+ dorsal pedal pulses Neuro: Alert and oriented. Moves all extremities spontaneously. Psych:  Responds to questions appropriately with a normal affect.  No intake or output data in the 24  hours ending 12/19/18 1252  Inpatient Medications:  . [MAR Hold] aspirin  324 mg Oral Once  . [START ON 12/20/2018] aspirin  81 mg Oral Pre-Cath  . [MAR Hold] metoprolol tartrate  25 mg Oral BID  . [MAR Hold] nicotine  21 mg Transdermal Daily  . [MAR Hold] nitroGLYCERIN  0.5 inch Topical Q6H  . [MAR Hold] rosuvastatin  20 mg Oral q1800  . sodium chloride flush  3 mL Intravenous Q12H   Infusions:  . sodium chloride 75 mL/hr at 12/19/18 0013  . sodium chloride    . [START ON 12/20/2018] sodium chloride 3 mL/kg/hr (12/19/18 1210)   Followed by  . [START ON 12/20/2018] sodium chloride    . heparin Stopped (12/19/18 1200)    Labs: Recent Labs    12/18/18 1520 12/19/18 0542  NA 140 139  K 3.4* 3.9  CL 106 108  CO2 25 22  GLUCOSE 99 84  BUN 20 17  CREATININE 0.94 0.75  CALCIUM 8.7* 8.3*   No results for input(s): AST, ALT, ALKPHOS, BILITOT, PROT, ALBUMIN in the last 72 hours. Recent Labs    12/18/18 1520 12/19/18 0542  WBC 6.9 6.9  HGB 15.7 15.1  HCT 45.6 44.9  MCV 96.6 96.8  PLT 181 164   Recent Labs    12/18/18 1520 12/18/18 2358 12/19/18 0542  TROPONINI 0.28* 0.25* 0.17*   Invalid input(s): POCBNP Recent Labs    12/18/18 2358  HGBA1C 5.5     Weights: Filed Weights   12/18/18 1513  Weight: 75.3 kg     Radiology/Studies:  Dg Chest 2 View  Result Date: 12/18/2018 CLINICAL DATA:  Chest pain. EXAM: CHEST - 2 VIEW COMPARISON:  None. FINDINGS: The heart size and mediastinal contours are within normal limits. Coronary artery stent in place. Both lungs are clear. No effusions. The visualized  skeletal structures are unremarkable. IMPRESSION: No acute abnormalities. Electronically Signed   By: Lorriane Shire M.D.   On: 12/18/2018 15:40     Assessment and Recommendation  57 y.o. male with essential hypertension mixed hyperlipidemia coronary artery disease status post previous myocardial stenting with non-ST elevation myocardial infarction with new critical stenosis  of proximal right coronary artery PCI and stent placement of proximal right coronary artery 2.  Dual antiplatelet therapy 3.  High intensity cholesterol therapy 4.  Beta-blocker for risk reduction of myocardial infarction and treatment of myocardial infarction 5.  Begin cardiac rehab  Signed, Serafina Royals M.D. FACC

## 2018-12-20 LAB — BASIC METABOLIC PANEL
Anion gap: 8 (ref 5–15)
BUN: 13 mg/dL (ref 6–20)
CO2: 21 mmol/L — ABNORMAL LOW (ref 22–32)
Calcium: 8.6 mg/dL — ABNORMAL LOW (ref 8.9–10.3)
Chloride: 111 mmol/L (ref 98–111)
Creatinine, Ser: 0.93 mg/dL (ref 0.61–1.24)
GFR calc Af Amer: >60 mL/min (ref >60)
GFR calc non Af Amer: >60 mL/min (ref >60)
Glucose, Bld: 95 mg/dL (ref 70–99)
POTASSIUM: 3.7 mmol/L (ref 3.5–5.1)
Sodium: 140 mmol/L (ref 135–145)

## 2018-12-20 LAB — CBC
HCT: 44.2 % (ref 39.0–52.0)
Hemoglobin: 15.5 g/dL (ref 13.0–17.0)
MCH: 33.5 pg (ref 26.0–34.0)
MCHC: 35.1 g/dL (ref 30.0–36.0)
MCV: 95.7 fL (ref 80.0–100.0)
Platelets: 193 10*3/uL (ref 150–400)
RBC: 4.62 MIL/uL (ref 4.22–5.81)
RDW: 12 % (ref 11.5–15.5)
WBC: 7.3 10*3/uL (ref 4.0–10.5)
nRBC: 0 % (ref 0.0–0.2)

## 2018-12-20 MED ORDER — TICAGRELOR 90 MG PO TABS: 90.0000 mg | ORAL_TABLET | Freq: Two times a day (BID) | ORAL | 0 refills | Status: DC

## 2018-12-20 MED ORDER — NITROGLYCERIN 0.4 MG SL SUBL: 0.4000 mg | SUBLINGUAL_TABLET | SUBLINGUAL | 0 refills | Status: DC | PRN

## 2018-12-20 MED ORDER — ASPIRIN 81 MG PO CHEW: 81.0000 mg | CHEWABLE_TABLET | Freq: Every day | ORAL | 0 refills | Status: DC

## 2018-12-20 MED ORDER — ROSUVASTATIN CALCIUM 20 MG PO TABS: 20.0000 mg | ORAL_TABLET | Freq: Every day | ORAL | 0 refills | Status: DC

## 2018-12-20 MED ORDER — METOPROLOL TARTRATE 25 MG PO TABS: 25.0000 mg | ORAL_TABLET | Freq: Two times a day (BID) | ORAL | 0 refills | Status: DC

## 2018-12-20 MED ORDER — NICOTINE 21 MG/24HR TD PT24: 21.0000 mg | MEDICATED_PATCH | Freq: Every day | TRANSDERMAL | 0 refills | Status: DC

## 2018-12-20 NOTE — Discharge Summary (Signed)
Brad Cole at [place] NAME: Brad Cole    MR#:  [id number]  DATE OF BIRTH:  [birth year]  DATE OF ADMISSION:  12/18/2018   ADMITTING PHYSICIAN: Dustin Flock, MD  DATE OF DISCHARGE: No discharge date for patient encounter.  PRIMARY CARE PHYSICIAN: Guadalupe Maple, MD   ADMISSION DIAGNOSIS:  NSTEMI (non-ST elevated myocardial infarction) ([place]) [I21.4] Troponin I above reference range [R79.89] Chest pain, unspecified type [R07.9] DISCHARGE DIAGNOSIS:  Active Problems:   Unstable angina (H[facility])   NSTEMI (non-ST elevated myocardial infarction) ([place])  SECONDARY DIAGNOSIS:   Past Medical History:  Diagnosis Date  . CAD (coronary artery disease)   . Hyperlipidemia   . Hypertension   . Migraine   . Tobacco abuse    HOSPITAL COURSE:   Chief complaint  Chest Pain    HISTORY OF PRESENT ILLNESS:  Brad Cole  is a 58 y.o. male with a known history of coronary artery disease with previous history of stent in 2014 who presented with chest pain ongoing for 1 week.  Patient states that he has been having left-sided chest pain especially with activity.  Also having pain radiating to his bilateral jaws as well as going down his arm.  Similar type of symptoms happen when he had his stent previously.  Patient was seen by his cardiologist who referred him to ED for admission.  He also states that he gets short of breath when he has these chest pains.  Please refer to the H&P dictated for further details.   HOSPITAL COURSE:  1 Non-ST elevation MI: EKG with new T wave inversions in the inferior leads and troponin max of 0.2.  Patient was seen by cardiologist during this admission.  Patient had PCI and stent placement of the proximal right coronary artery with success and no complication.  Patient ambulating well and remains completely asymptomatic.  Patient cleared for discharge by cardiology service with recommendation to follow-up in clinic.   Recommendation is to continue weight dual antiplatelet therapy with aspirin and Brilinta.  Placed on high intensity statin.  Patient noted to be allergic to atorvastatin.  Currently on Crestor and tolerating well.  Continue the same on discharge.  Continue beta-blockers.  2.Tobacco dependence: Patient is encouraged to quit smoking. Counseling was provided and patient was placed on nicotine patch which was also continued on discharge.  Follow-up with primary care physician.   3.  Essential hypertension: Controlled on current regimen.  Continue metoprolol and adjust medications   4.  Hyperlipidemia: Continue statin  DISCHARGE CONDITIONS:  Stable CONSULTS OBTAINED:  Treatment Team:  Corey Skains, MD DRUG ALLERGIES:   Allergies  Allergen Reactions  . Atorvastatin Other (See Comments)    "makes me feel bad"   DISCHARGE MEDICATIONS:   Allergies as of 12/20/2018      Reactions   Atorvastatin Other (See Comments)   "makes me feel bad"      Medication List    STOP taking these medications   propranolol 10 MG tablet Commonly known as:  INDERAL     TAKE these medications   aspirin 81 MG chewable tablet Chew 1 tablet (81 mg total) by mouth daily. Start taking on:  December 21, 2018   metoprolol tartrate 25 MG tablet Commonly known as:  LOPRESSOR Take 1 tablet (25 mg total) by mouth 2 (two) times daily.   multivitamin tablet Take 1 tablet by mouth daily.   nicotine 21 mg/24hr patch Commonly known as:  NICODERM CQ - dosed in mg/24 hours Place 1 patch (21 mg total) onto the skin daily. Start taking on:  December 21, 2018   nitroGLYCERIN 0.4 MG SL tablet Commonly known as:  NITROSTAT Place 1 tablet (0.4 mg total) under the tongue every 5 (five) minutes as needed for chest pain.   rosuvastatin 20 MG tablet Commonly known as:  CRESTOR Take 1 tablet (20 mg total) by mouth daily at 6 PM.   ticagrelor 90 MG Tabs tablet Commonly known as:  BRILINTA Take 1 tablet (90 mg  total) by mouth 2 (two) times daily.        DISCHARGE INSTRUCTIONS:   DIET:  Cardiac diet DISCHARGE CONDITION:  Stable ACTIVITY:  Activity as tolerated OXYGEN:  Home Oxygen: No.  Oxygen Delivery: room air DISCHARGE LOCATION:  home   If you experience worsening of your admission symptoms, develop shortness of breath, life threatening emergency, suicidal or homicidal thoughts you must seek medical attention immediately by calling 911 or calling your MD immediately  if symptoms less severe.  You Must read complete instructions/literature along with all the possible adverse reactions/side effects for all the Medicines you take and that have been prescribed to you. Take any new Medicines after you have completely understood and accpet all the possible adverse reactions/side effects.   Please note  You were cared for by a hospitalist during your hospital stay. If you have any questions about your discharge medications or the care you received while you were in the hospital after you are discharged, you can call the unit and asked to speak with the hospitalist on call if the hospitalist that took care of you is not available. Once you are discharged, your primary care physician will handle any further medical issues. Please note that NO REFILLS for any discharge medications will be authorized once you are discharged, as it is imperative that you return to your primary care physician (or establish a relationship with a primary care physician if you do not have one) for your aftercare needs so that they can reassess your need for medications and monitor your lab values.    On the day of Discharge:  VITAL SIGNS:  Blood pressure 117/84, pulse 86, temperature 98.5 F (36.9 C), temperature source Oral, resp. rate 20, height 5\' 6"  (1.676 m), weight 73.4 kg, SpO2 98 %. PHYSICAL EXAMINATION:  GENERAL:  58 y.o.-year-old patient lying in the bed with no acute distress.  EYES: Pupils equal, round,  reactive to light and accommodation. No scleral icterus. Extraocular muscles intact.  HEENT: Head atraumatic, normocephalic. Oropharynx and nasopharynx clear.  NECK:  Supple, no jugular venous distention. No thyroid enlargement, no tenderness.  LUNGS: Normal breath sounds bilaterally, no wheezing, rales,rhonchi or crepitation. No use of accessory muscles of respiration.  CARDIOVASCULAR: S1, S2 normal. No murmurs, rubs, or gallops.  ABDOMEN: Soft, non-tender, non-distended. Bowel sounds present. No organomegaly or mass.  EXTREMITIES: No pitting edema.  Pulses palpable.Marland Kitchen  NEUROLOGIC: Grossly intact.  Muscle strength 5/5 in all extremities. Sensation intact. Gait not checked.  PSYCHIATRIC: The patient is alert and oriented x 3.  SKIN: No obvious rash, lesion, or ulcer.  DATA REVIEW:   CBC Recent Labs  Lab 12/20/18 0353  WBC 7.3  HGB 15.5  HCT 44.2  PLT 193    Chemistries  Recent Labs  Lab 12/20/18 0353  NA 140  K 3.7  CL 111  CO2 21*  GLUCOSE 95  BUN 13  CREATININE 0.93  CALCIUM 8.6*  Microbiology Results  No results found for this or any previous visit.  RADIOLOGY:  Dg Chest Port 1 View  Result Date: 12/20/2018 CLINICAL DATA:  Cough EXAM: PORTABLE CHEST 1 VIEW COMPARISON:  12/18/2018 chest radiograph. FINDINGS: Stable cardiomediastinal silhouette with normal heart size. No pneumothorax. No pleural effusion. Lungs appear clear, with no acute consolidative airspace disease and no pulmonary edema. IMPRESSION: No active disease. Electronically Signed   By: Ilona Sorrel M.D.   On: 12/20/2018 00:23     Management plans discussed with the patient, family and they are in agreement.  CODE STATUS: Full Code   TOTAL TIME TAKING CARE OF THIS PATIENT: 40 minutes.    Dominie Benedick M.D on 12/20/2018 at 12:09 PM  Between 7am to 6pm - Pager - [phone]  After 6pm go to [email or web address] - Proofreader  [facility]  Office  [phone]  CC:  Primary care physician; Guadalupe Maple, MD   Note: This dictation was prepared with Dragon dictation along with smaller phrase technology. Any transcriptional errors that result from this process are unintentional.

## 2018-12-20 NOTE — Progress Notes (Signed)
Patient complaining of SOB. Reports that he has to "take a deep breath every few breaths to fully catch my breath". Slight inspiratory wheeze heard throughout the lungs, oxygen saturations 97% on room air. MD Jannifer Franklin notified. Orders placed for CXR. Will continue to monitor.  Brad Cole M

## 2018-12-20 NOTE — Plan of Care (Signed)
  Problem: Clinical Measurements: Goal: Ability to maintain clinical measurements within normal limits will improve Outcome: Progressing Goal: Will remain free from infection Outcome: Progressing   Problem: Cardiovascular: Goal: Vascular access site(s) Level 0-1 will be maintained Outcome: Progressing   Problem: Clinical Measurements: Goal: Respiratory complications will improve Outcome: Not Progressing Note:  Patient complaining of SOB. Reports that he has to "take a deep breath every few breaths to fully catch my breath". Slight inspiratory wheeze heard throughout the lungs, oxygen saturations 97% on room air. MD Jannifer Franklin notified.

## 2018-12-20 NOTE — Discharge Instructions (Signed)
Radial Site Care ° °This sheet gives you information about how to care for yourself after your procedure. Your health care provider may also give you more specific instructions. If you have problems or questions, contact your health care provider. °What can I expect after the procedure? °After the procedure, it is common to have: °· Bruising and tenderness at the catheter insertion area. °Follow these instructions at home: °Medicines °· Take over-the-counter and prescription medicines only as told by your health care provider. °Insertion site care °· Follow instructions from your health care provider about how to take care of your insertion site. Make sure you: °? Wash your hands with soap and water before you change your bandage (dressing). If soap and water are not available, use hand sanitizer. °? Change your dressing as told by your health care provider. °? Leave stitches (sutures), skin glue, or adhesive strips in place. These skin closures may need to stay in place for 2 weeks or longer. If adhesive strip edges start to loosen and curl up, you may trim the loose edges. Do not remove adhesive strips completely unless your health care provider tells you to do that. °· Check your insertion site every day for signs of infection. Check for: °? Redness, swelling, or pain. °? Fluid or blood. °? Pus or a bad smell. °? Warmth. °· Do not take baths, swim, or use a hot tub until your health care provider approves. °· You may shower 24-48 hours after the procedure, or as directed by your health care provider. °? Remove the dressing and gently wash the site with plain soap and water. °? Pat the area dry with a clean towel. °? Do not rub the site. That could cause bleeding. °· Do not apply powder or lotion to the site. °Activity ° °· For 24 hours after the procedure, or as directed by your health care provider: °? Do not flex or bend the affected arm. °? Do not push or pull heavy objects with the affected arm. °? Do not  drive yourself home from the hospital or clinic. You may drive 24 hours after the procedure unless your health care provider tells you not to. °? Do not operate machinery or power tools. °· Do not lift anything that is heavier than 10 lb (4.5 kg), or the limit that you are told, until your health care provider says that it is safe. °· Ask your health care provider when it is okay to: °? Return to work or school. °? Resume usual physical activities or sports. °? Resume sexual activity. °General instructions °· If the catheter site starts to bleed, raise your arm and put firm pressure on the site. If the bleeding does not stop, get help right away. This is a medical emergency. °· If you went home on the same day as your procedure, a responsible adult should be with you for the first 24 hours after you arrive home. °· Keep all follow-up visits as told by your health care provider. This is important. °Contact a health care provider if: °· You have a fever. °· You have redness, swelling, or yellow drainage around your insertion site. °Get help right away if: °· You have unusual pain at the radial site. °· The catheter insertion area swells very fast. °· The insertion area is bleeding, and the bleeding does not stop when you hold steady pressure on the area. °· Your arm or hand becomes pale, cool, tingly, or numb. °These symptoms may represent a serious problem   that is an emergency. Do not wait to see if the symptoms will go away. Get medical help right away. Call your local emergency services (911 in the [place] Do not drive yourself to the hospital. °Summary °· After the procedure, it is common to have bruising and tenderness at the site. °· Follow instructions from your health care provider about how to take care of your radial site wound. Check the wound every day for signs of infection. °· Do not lift anything that is heavier than 10 lb (4.5 kg), or the limit that you are told, until your health care provider says  that it is safe. °This information is not intended to replace advice given to you by your health care provider. Make sure you discuss any questions you have with your health care provider. °Document Released: 01/08/2011 Document Revised: 01/11/2018 Document Reviewed: 01/11/2018 °Elsevier Interactive Patient Education © 2019 [employer] ° °

## 2018-12-20 NOTE — Progress Notes (Signed)
[facility] Encounter Note  Patient: Fount Bahe / Admit Date: 12/18/2018 / Date of Encounter: 12/20/2018, 7:27 AM   Subjective: Patient feeling much better overnight with no further ethicist of chest pain.  Troponin elevation consistent with minimal non-ST elevation myocardial infarction Cardiac catheterization showing normal LV systolic function with ejection fraction of 55% Patent stent in the left anterior descending artery Critical proximal right coronary artery stenosis of 95% Successful PCI and stent placement of proximal right coronary artery as well as distal right coronary artery without complication Review of Systems: Positive for: None Negative for: Vision change, hearing change, syncope, dizziness, nausea, vomiting,diarrhea, bloody stool, stomach pain, cough, congestion, diaphoresis, urinary frequency, urinary pain,skin lesions, skin rashes Others previously listed  Objective: Telemetry: Normal sinus rhythm Physical Exam: Blood pressure 110/74, pulse 62, temperature 98.4 F (36.9 C), temperature source Oral, resp. rate 18, height 5\' 6"  (1.676 m), weight 73.4 kg, SpO2 98 %. Body mass index is 26.12 kg/m. General: Well developed, well nourished, in no acute distress. Head: Normocephalic, atraumatic, sclera non-icteric, no xanthomas, nares are without discharge. Neck: No apparent masses Lungs: Normal respirations with no wheezes, no rhonchi, no rales , no crackles   Heart: Regular rate and rhythm, normal S1 S2, no murmur, no rub, no gallop, PMI is normal size and placement, carotid upstroke normal without bruit, jugular venous pressure normal Abdomen: Soft, non-tender, non-distended with normoactive bowel sounds. No hepatosplenomegaly. Abdominal aorta is normal size without bruit Extremities: No edema, no clubbing, no cyanosis, no ulcers,  Peripheral: 2+ radial, 2+ femoral, 2+ dorsal pedal pulses Neuro: Alert and oriented. Moves all extremities  spontaneously. Psych:  Responds to questions appropriately with a normal affect.   Intake/Output Summary (Last 24 hours) at 12/20/2018 0727 Last data filed at 12/20/2018 0000 Gross per 24 hour  Intake 902.3 ml  Output 400 ml  Net 502.3 ml    Inpatient Medications:  . aspirin  324 mg Oral Once  . aspirin  81 mg Oral Daily  . enoxaparin (LOVENOX) injection  40 mg Subcutaneous Q24H  . metoprolol tartrate  25 mg Oral BID  . nicotine  21 mg Transdermal Daily  . rosuvastatin  20 mg Oral q1800  . sodium chloride flush  3 mL Intravenous Q12H  . ticagrelor  90 mg Oral BID   Infusions:  . sodium chloride      Labs: Recent Labs    12/19/18 0542 12/20/18 0353  NA 139 140  K 3.9 3.7  CL 108 111  CO2 22 21*  GLUCOSE 84 95  BUN 17 13  CREATININE 0.75 0.93  CALCIUM 8.3* 8.6*   No results for input(s): AST, ALT, ALKPHOS, BILITOT, PROT, ALBUMIN in the last 72 hours. Recent Labs    12/19/18 0542 12/20/18 0353  WBC 6.9 7.3  HGB 15.1 15.5  HCT 44.9 44.2  MCV 96.8 95.7  PLT 164 193   Recent Labs    12/18/18 1520 12/18/18 2358 12/19/18 0542 12/19/18 1724  TROPONINI 0.28* 0.25* 0.17* 0.15*   Invalid input(s): POCBNP Recent Labs    12/18/18 2358  HGBA1C 5.5     Weights: Filed Weights   12/18/18 1513 12/20/18 0508  Weight: 75.3 kg 73.4 kg     Radiology/Studies:  Dg Chest 2 View  Result Date: 12/18/2018 CLINICAL DATA:  Chest pain. EXAM: CHEST - 2 VIEW COMPARISON:  None. FINDINGS: The heart size and mediastinal contours are within normal limits. Coronary artery stent in place. Both lungs are clear. No  effusions. The visualized skeletal structures are unremarkable. IMPRESSION: No acute abnormalities. Electronically Signed   By: Lorriane Shire M.D.   On: 12/18/2018 15:40   Dg Chest Port 1 View  Result Date: 12/20/2018 CLINICAL DATA:  Cough EXAM: PORTABLE CHEST 1 VIEW COMPARISON:  12/18/2018 chest radiograph. FINDINGS: Stable cardiomediastinal silhouette with normal heart  size. No pneumothorax. No pleural effusion. Lungs appear clear, with no acute consolidative airspace disease and no pulmonary edema. IMPRESSION: No active disease. Electronically Signed   By: Ilona Sorrel M.D.   On: 12/20/2018 00:23     Assessment and Recommendation  58 y.o. male with essential hypertension mixed hyperlipidemia coronary artery disease status post previous myocardial stenting with non-ST elevation myocardial infarction with new critical stenosis of proximal right coronary artery PCI and stent placement of proximal right coronary artery with success and no complication and ambulating well 2.  Dual antiplatelet therapy 3.  High intensity cholesterol therapy 4.  Beta-blocker for risk reduction of myocardial infarction and treatment of myocardial infarction 5.  Begin cardiac rehab delay today following for any worsening symptoms 6.  Okay for discharged home from cardiac standpoint with follow-up in 2 weeks  Signed, Serafina Royals M.D. FACC

## 2018-12-21 ENCOUNTER — Telehealth: Payer: Self-pay | Admitting: Family Medicine

## 2018-12-21 ENCOUNTER — Encounter: Payer: Self-pay | Admitting: Internal Medicine

## 2018-12-21 ENCOUNTER — Telehealth: Payer: Self-pay

## 2018-12-21 LAB — POCT ACTIVATED CLOTTING TIME
ACTIVATED CLOTTING TIME: 290 s
Activated Clotting Time: 318 seconds

## 2018-12-21 NOTE — Telephone Encounter (Signed)
I have made the 1st attempt to contact the patient or family member in charge, in order to follow up from recently being discharged from the hospital. I left a message on voicemail requesting a CB.

## 2018-12-21 NOTE — Telephone Encounter (Signed)
Copied from [place] [phone] [id number]. Topic: Quick Communication - See Telephone Encounter >> Dec 21, 2018  3:48 PM Blase Mess A wrote: CRM for notification. See Telephone encounter for: 12/21/18.  Patient is in need of a hospital follow up with Dr. Jeananne Rama. Dr. Jeananne Rama is unable until Feb. Please advise [phone]

## 2018-12-22 ENCOUNTER — Telehealth: Payer: Self-pay

## 2018-12-22 ENCOUNTER — Other Ambulatory Visit: Payer: Self-pay | Admitting: *Deleted

## 2018-12-22 NOTE — Telephone Encounter (Signed)
Appt scheduled

## 2018-12-22 NOTE — Telephone Encounter (Signed)
Transition Care Management Follow-up Telephone Call  Date of discharge and from where: [facility] on 12/20/2018  How have you been since you were released from the hospital? Jenner tired  Any questions or concerns? Yes, ear has been dripping fluid on and off over the last few months and would like to be seen for that. He has been seen before but what he was prescribed didn't work  Items Reviewed:  Did the pt receive and understand the discharge instructions provided? Yes   Medications obtained and verified? Yes   Any new allergies since your discharge? No   Dietary orders reviewed? Yes  Do you have support at home? Yes , lives with wife  Other (ie: DME, Home Health, etc) N/A  Functional Questionnaire: (I = Independent and D = Dependent) ADL's: I  Bathing/Dressing- I   Meal Prep- I  Eating- I  Maintaining continence- I  Transferring/Ambulation- I  Managing Meds- I   Follow up appointments reviewed:    PCP Hospital f/u appt confirmed? Yes  Scheduled to see Merrie Roof, [place] on 12/27/2018 @ 9:45am.  [facility] f/u appt confirmed?  Yes Scheduled to see Cardiologist on 12/28/2018   Are transportation arrangements needed? No   If their condition worsens, is the pt aware to call  their PCP or go to the ED? Yes  Was the patient provided with contact information for the PCP's office or ED? Yes  Was the pt encouraged to call back with questions or concerns? Yes

## 2018-12-27 ENCOUNTER — Inpatient Hospital Stay: Payer: BLUE CROSS/BLUE SHIELD | Admitting: Family Medicine

## 2018-12-28 DIAGNOSIS — I2510 Atherosclerotic heart disease of native coronary artery without angina pectoris: Secondary | ICD-10-CM | POA: Diagnosis not present

## 2018-12-28 DIAGNOSIS — I10 Essential (primary) hypertension: Secondary | ICD-10-CM | POA: Diagnosis not present

## 2018-12-28 DIAGNOSIS — E7801 Familial hypercholesterolemia: Secondary | ICD-10-CM | POA: Diagnosis not present

## 2019-09-26 ENCOUNTER — Ambulatory Visit: Payer: BLUE CROSS/BLUE SHIELD | Admitting: Nurse Practitioner

## 2019-09-27 ENCOUNTER — Other Ambulatory Visit: Payer: Self-pay

## 2019-09-27 ENCOUNTER — Encounter: Payer: Self-pay | Admitting: Unknown Physician Specialty

## 2019-09-27 ENCOUNTER — Ambulatory Visit (INDEPENDENT_AMBULATORY_CARE_PROVIDER_SITE_OTHER): Payer: BC Managed Care – PPO | Admitting: Unknown Physician Specialty

## 2019-09-27 DIAGNOSIS — H6523 Chronic serous otitis media, bilateral: Secondary | ICD-10-CM

## 2019-09-27 DIAGNOSIS — D171 Benign lipomatous neoplasm of skin and subcutaneous tissue of trunk: Secondary | ICD-10-CM | POA: Diagnosis not present

## 2019-09-27 MED ORDER — NEOMYCIN-POLYMYXIN-HC 3.5-10000-1 OT SOLN: 4.0000 [drp] | Freq: Four times a day (QID) | OTIC | 0 refills | Status: DC

## 2019-09-27 NOTE — Progress Notes (Signed)
BP 131/87   Pulse 77   Temp 99.2 F (37.3 C)   SpO2 98%    Subjective:    Patient ID: Brad Cole, male    DOB: February 10, 1961, 58 y.o.   MRN: [id number]  HPI: Brad Cole is a 58 y.o. male  Chief Complaint  Patient presents with  . Mass    right side, for a couple of months, feels like a bruise   Pt states he has "a know" on right side abdominal area.  States it is a little sore.  Noticed it for 2 months.  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  No appreciable changes in size.  No fever or weight change.    Pt states he has trouble with ears at night.  States they "run" like a spigot.  States in the morning it resolves.  Used grandson's ear medicine it cleared up but came back.  Uses q tips daily.    Relevant past medical, surgical, family and social history reviewed and updated as indicated. Interim medical history since our last visit reviewed. Allergies and medications reviewed and updated.  Review of Systems  Per HPI unless specifically indicated above     Objective:    BP 131/87   Pulse 77   Temp 99.2 F (37.3 C)   SpO2 98%   Wt Readings from Last 3 Encounters:  12/20/18 161 lb 12.8 oz (73.4 kg)  07/27/18 173 lb (78.5 kg)  08/17/17 166 lb (75.3 kg)    Physical Exam Constitutional:      General: He is not in acute distress.    Appearance: Normal appearance. He is well-developed.  HENT:     Head: Normocephalic and atraumatic.     Comments: Bilateral ear drums scarred.  Erythema of ear canals Eyes:     General: Lids are normal. No scleral icterus.       Right eye: No discharge.        Left eye: No discharge.     Conjunctiva/sclera: Conjunctivae normal.  Cardiovascular:     Rate and Rhythm: Normal rate.  Pulmonary:     Effort: Pulmonary effort is normal.  Abdominal:     Palpations: There is no hepatomegaly or splenomegaly.  Musculoskeletal: Normal range of motion.  Skin:    Coloration: Skin is not pale.     Findings: No rash.     Comments:  Pea sized area right abominal wall consistent with lipoma  Neurological:     Mental Status: He is alert and oriented to person, place, and time.  Psychiatric:        Behavior: Behavior normal.        Thought Content: Thought content normal.        Judgment: Judgment normal.     Results for orders placed or performed during the hospital encounter of [id number]  Basic metabolic panel  Result Value Ref Range   Sodium 140 135 - 145 mmol/L   Potassium 3.4 (L) 3.5 - 5.1 mmol/L   Chloride 106 98 - 111 mmol/L   CO2 25 22 - 32 mmol/L   Glucose, Bld 99 70 - 99 mg/dL   BUN 20 6 - 20 mg/dL   Creatinine, Ser 0.94 0.61 - 1.24 mg/dL   Calcium 8.7 (L) 8.9 - 10.3 mg/dL   GFR calc non Af Amer >60 >60 mL/min   GFR calc Af Amer >60 >60 mL/min   Anion gap 9 5 - 15  CBC  Result Value Ref Range  WBC 6.9 4.0 - 10.5 K/uL   RBC 4.72 4.22 - 5.81 MIL/uL   Hemoglobin 15.7 13.0 - 17.0 g/dL   HCT 45.6 39.0 - 52.0 %   MCV 96.6 80.0 - 100.0 fL   MCH 33.3 26.0 - 34.0 pg   MCHC 34.4 30.0 - 36.0 g/dL   RDW 12.0 11.5 - 15.5 %   Platelets 181 150 - 400 K/uL   nRBC 0.0 0.0 - 0.2 %  Troponin I - ONCE - STAT  Result Value Ref Range   Troponin I 0.28 (HH) <0.03 ng/mL  Protime-INR  Result Value Ref Range   Prothrombin Time 12.4 11.4 - 15.2 seconds   INR 0.93   APTT  Result Value Ref Range   aPTT 31 24 - 36 seconds  Heparin level (unfractionated)  Result Value Ref Range   Heparin Unfractionated 0.71 (H) 0.30 - 0.70 IU/mL  TSH  Result Value Ref Range   TSH 1.614 0.350 - 4.500 uIU/mL  Troponin I - Now Then Q6H  Result Value Ref Range   Troponin I 0.25 (HH) <0.03 ng/mL  Troponin I - Now Then Q6H  Result Value Ref Range   Troponin I 0.17 (HH) <0.03 ng/mL  Troponin I - Now Then Q6H  Result Value Ref Range   Troponin I 0.15 (HH) <0.03 ng/mL  CBC  Result Value Ref Range   WBC 6.9 4.0 - 10.5 K/uL   RBC 4.64 4.22 - 5.81 MIL/uL   Hemoglobin 15.1 13.0 - 17.0 g/dL   HCT 44.9 39.0 - 52.0 %   MCV 96.8 80.0 -  100.0 fL   MCH 32.5 26.0 - 34.0 pg   MCHC 33.6 30.0 - 36.0 g/dL   RDW 12.2 11.5 - 15.5 %   Platelets 164 150 - 400 K/uL   nRBC 0.0 0.0 - 0.2 %  Basic metabolic panel  Result Value Ref Range   Sodium 139 135 - 145 mmol/L   Potassium 3.9 3.5 - 5.1 mmol/L   Chloride 108 98 - 111 mmol/L   CO2 22 22 - 32 mmol/L   Glucose, Bld 84 70 - 99 mg/dL   BUN 17 6 - 20 mg/dL   Creatinine, Ser 0.75 0.61 - 1.24 mg/dL   Calcium 8.3 (L) 8.9 - 10.3 mg/dL   GFR calc non Af Amer >60 >60 mL/min   GFR calc Af Amer >60 >60 mL/min   Anion gap 9 5 - 15  Hemoglobin A1c  Result Value Ref Range   Hgb A1c MFr Bld 5.5 4.8 - 5.6 %   Mean Plasma Glucose 111.15 mg/dL  Heparin level (unfractionated)  Result Value Ref Range   Heparin Unfractionated 0.43 0.30 - 0.70 IU/mL  Basic metabolic panel  Result Value Ref Range   Sodium 140 135 - 145 mmol/L   Potassium 3.7 3.5 - 5.1 mmol/L   Chloride 111 98 - 111 mmol/L   CO2 21 (L) 22 - 32 mmol/L   Glucose, Bld 95 70 - 99 mg/dL   BUN 13 6 - 20 mg/dL   Creatinine, Ser 0.93 0.61 - 1.24 mg/dL   Calcium 8.6 (L) 8.9 - 10.3 mg/dL   GFR calc non Af Amer >60 >60 mL/min   GFR calc Af Amer >60 >60 mL/min   Anion gap 8 5 - 15  CBC  Result Value Ref Range   WBC 7.3 4.0 - 10.5 K/uL   RBC 4.62 4.22 - 5.81 MIL/uL   Hemoglobin 15.5 13.0 - 17.0 g/dL  HCT 44.2 39.0 - 52.0 %   MCV 95.7 80.0 - 100.0 fL   MCH 33.5 26.0 - 34.0 pg   MCHC 35.1 30.0 - 36.0 g/dL   RDW 12.0 11.5 - 15.5 %   Platelets 193 150 - 400 K/uL   nRBC 0.0 0.0 - 0.2 %  POCT Activated clotting time  Result Value Ref Range   Activated Clotting Time 290 seconds  POCT Activated clotting time  Result Value Ref Range   Activated Clotting Time 318 seconds      Assessment & Plan:   Problem List Items Addressed This Visit      Unprioritized   Lipoma of abdominal wall    Discussed weekly self monitoring.  If it gets larger, will refer for biopsy.        Serous otitis media    Chronic and probably related to  Q tips.  Will rx Corticosporin.  Refer to ENT as probably ear drum rupture for Q tip use      Relevant Orders   Ambulatory referral to ENT       Follow up plan: Return if symptoms worsen or fail to improve.

## 2019-09-27 NOTE — Assessment & Plan Note (Signed)
Discussed weekly self monitoring.  If it gets larger, will refer for biopsy.

## 2019-09-27 NOTE — Assessment & Plan Note (Signed)
Chronic and probably related to Q tips.  Will rx Corticosporin.  Refer to ENT as probably ear drum rupture for Q tip use

## 2020-08-10 ENCOUNTER — Other Ambulatory Visit: Payer: Self-pay

## 2020-08-10 ENCOUNTER — Emergency Department
Admission: EM | Admit: 2020-08-10 | Discharge: 2020-08-10 | Disposition: A | Discharge status: Home / Self Care | Payer: BC Managed Care – PPO | Source: Home / Self Care

## 2020-08-10 ENCOUNTER — Inpatient Hospital Stay
Admission: EM | Admit: 2020-08-10 | Discharge: 2020-08-12 | DRG: 247 | Disposition: A | Discharge status: Home / Self Care | Payer: BC Managed Care – PPO | Attending: Family Medicine | Admitting: Family Medicine

## 2020-08-10 ENCOUNTER — Emergency Department: Payer: BC Managed Care – PPO

## 2020-08-10 DIAGNOSIS — Z5321 Procedure and treatment not carried out due to patient leaving prior to being seen by health care provider: Secondary | ICD-10-CM | POA: Insufficient documentation

## 2020-08-10 DIAGNOSIS — R319 Hematuria, unspecified: Secondary | ICD-10-CM | POA: Diagnosis present

## 2020-08-10 DIAGNOSIS — I10 Essential (primary) hypertension: Secondary | ICD-10-CM | POA: Diagnosis not present

## 2020-08-10 DIAGNOSIS — Z800 Family history of malignant neoplasm of digestive organs: Secondary | ICD-10-CM | POA: Diagnosis not present

## 2020-08-10 DIAGNOSIS — I255 Ischemic cardiomyopathy: Secondary | ICD-10-CM | POA: Diagnosis not present

## 2020-08-10 DIAGNOSIS — Z7982 Long term (current) use of aspirin: Secondary | ICD-10-CM | POA: Diagnosis not present

## 2020-08-10 DIAGNOSIS — Z955 Presence of coronary angioplasty implant and graft: Secondary | ICD-10-CM

## 2020-08-10 DIAGNOSIS — I2510 Atherosclerotic heart disease of native coronary artery without angina pectoris: Secondary | ICD-10-CM | POA: Diagnosis not present

## 2020-08-10 DIAGNOSIS — G43909 Migraine, unspecified, not intractable, without status migrainosus: Secondary | ICD-10-CM | POA: Diagnosis not present

## 2020-08-10 DIAGNOSIS — Z8249 Family history of ischemic heart disease and other diseases of the circulatory system: Secondary | ICD-10-CM

## 2020-08-10 DIAGNOSIS — R072 Precordial pain: Secondary | ICD-10-CM | POA: Diagnosis not present

## 2020-08-10 DIAGNOSIS — Z9861 Coronary angioplasty status: Secondary | ICD-10-CM | POA: Diagnosis not present

## 2020-08-10 DIAGNOSIS — Z20822 Contact with and (suspected) exposure to covid-19: Secondary | ICD-10-CM | POA: Diagnosis present

## 2020-08-10 DIAGNOSIS — Z79899 Other long term (current) drug therapy: Secondary | ICD-10-CM

## 2020-08-10 DIAGNOSIS — Z888 Allergy status to other drugs, medicaments and biological substances status: Secondary | ICD-10-CM

## 2020-08-10 DIAGNOSIS — R0789 Other chest pain: Secondary | ICD-10-CM | POA: Diagnosis not present

## 2020-08-10 DIAGNOSIS — Z806 Family history of leukemia: Secondary | ICD-10-CM

## 2020-08-10 DIAGNOSIS — F17210 Nicotine dependence, cigarettes, uncomplicated: Secondary | ICD-10-CM | POA: Diagnosis present

## 2020-08-10 DIAGNOSIS — I214 Non-ST elevation (NSTEMI) myocardial infarction: Secondary | ICD-10-CM

## 2020-08-10 DIAGNOSIS — Z820 Family history of epilepsy and other diseases of the nervous system: Secondary | ICD-10-CM

## 2020-08-10 DIAGNOSIS — E785 Hyperlipidemia, unspecified: Secondary | ICD-10-CM | POA: Diagnosis not present

## 2020-08-10 DIAGNOSIS — Z7902 Long term (current) use of antithrombotics/antiplatelets: Secondary | ICD-10-CM | POA: Diagnosis not present

## 2020-08-10 DIAGNOSIS — I252 Old myocardial infarction: Secondary | ICD-10-CM

## 2020-08-10 DIAGNOSIS — R110 Nausea: Secondary | ICD-10-CM | POA: Diagnosis not present

## 2020-08-10 DIAGNOSIS — R079 Chest pain, unspecified: Secondary | ICD-10-CM | POA: Diagnosis not present

## 2020-08-10 LAB — APTT: aPTT: 28 seconds (ref 24–36)

## 2020-08-10 LAB — CBC
HCT: 45.4 % (ref 39.0–52.0)
Hemoglobin: 16.2 g/dL (ref 13.0–17.0)
MCH: 34.2 pg — ABNORMAL HIGH (ref 26.0–34.0)
MCHC: 35.7 g/dL (ref 30.0–36.0)
MCV: 95.8 fL (ref 80.0–100.0)
Platelets: 200 10*3/uL (ref 150–400)
RBC: 4.74 MIL/uL (ref 4.22–5.81)
RDW: 12 % (ref 11.5–15.5)
WBC: 7.2 10*3/uL (ref 4.0–10.5)
nRBC: 0 % (ref 0.0–0.2)

## 2020-08-10 LAB — BASIC METABOLIC PANEL
Anion gap: 11 (ref 5–15)
BUN: 19 mg/dL (ref 6–20)
CO2: 19 mmol/L — ABNORMAL LOW (ref 22–32)
Calcium: 8.7 mg/dL — ABNORMAL LOW (ref 8.9–10.3)
Chloride: 109 mmol/L (ref 98–111)
Creatinine, Ser: 0.95 mg/dL (ref 0.61–1.24)
GFR calc Af Amer: >60 mL/min (ref >60)
GFR calc non Af Amer: >60 mL/min (ref >60)
Glucose, Bld: 96 mg/dL (ref 70–99)
Potassium: 3.9 mmol/L (ref 3.5–5.1)
Sodium: 139 mmol/L (ref 135–145)

## 2020-08-10 LAB — PROTIME-INR
INR: 1.1 (ref 0.8–1.2)
Prothrombin Time: 13.4 seconds (ref 11.4–15.2)

## 2020-08-10 LAB — SARS CORONAVIRUS 2 BY RT PCR (HOSPITAL ORDER, PERFORMED IN ~~LOC~~ HOSPITAL LAB): SARS Coronavirus 2: NEGATIVE

## 2020-08-10 LAB — TROPONIN I (HIGH SENSITIVITY)
Troponin I (High Sensitivity): 79 ng/L — ABNORMAL HIGH (ref <18)
Troponin I (High Sensitivity): 850 ng/L (ref <18)
Troponin I (High Sensitivity): 3151 ng/L (ref <18)
Troponin I (High Sensitivity): 5570 ng/L (ref <18)

## 2020-08-10 MED ORDER — SODIUM CHLORIDE 0.9 % IV BOLUS
1000.0000 mL | Freq: Once | INTRAVENOUS | Status: AC
  Administered 2020-08-10: 1000 mL via INTRAVENOUS

## 2020-08-10 MED ORDER — SODIUM CHLORIDE 0.9 % IV SOLN: INTRAVENOUS | Status: DC

## 2020-08-10 MED ORDER — ASPIRIN EC 81 MG PO TBEC
81.0000 mg | DELAYED_RELEASE_TABLET | Freq: Every day | ORAL | Status: DC
  Administered 2020-08-11 – 2020-08-12 (×2): 81 mg via ORAL
  Filled 2020-08-10 (×2): qty 1

## 2020-08-10 MED ORDER — MORPHINE SULFATE (PF) 2 MG/ML IV SOLN
2.0000 mg | INTRAVENOUS | Status: DC | PRN
  Administered 2020-08-11 (×2): 2 mg via INTRAVENOUS
  Filled 2020-08-10 (×2): qty 1

## 2020-08-10 MED ORDER — HEPARIN BOLUS VIA INFUSION
4000.0000 [IU] | Freq: Once | INTRAVENOUS | Status: AC
  Administered 2020-08-10: 4000 [IU] via INTRAVENOUS
  Filled 2020-08-10: qty 4000

## 2020-08-10 MED ORDER — METOPROLOL TARTRATE 25 MG PO TABS
12.5000 mg | ORAL_TABLET | Freq: Two times a day (BID) | ORAL | Status: DC
  Administered 2020-08-10 – 2020-08-12 (×4): 12.5 mg via ORAL
  Filled 2020-08-10 (×5): qty 1

## 2020-08-10 MED ORDER — ONDANSETRON HCL 4 MG/2ML IJ SOLN
4.0000 mg | Freq: Four times a day (QID) | INTRAMUSCULAR | Status: DC | PRN
  Administered 2020-08-11: 4 mg via INTRAVENOUS
  Filled 2020-08-10: qty 2

## 2020-08-10 MED ORDER — NITROGLYCERIN 0.4 MG SL SUBL: 0.4000 mg | SUBLINGUAL_TABLET | SUBLINGUAL | Status: DC | PRN

## 2020-08-10 MED ORDER — HEPARIN (PORCINE) 25000 UT/250ML-% IV SOLN
1200.0000 [IU]/h | INTRAVENOUS | Status: DC
  Administered 2020-08-10: 950 [IU]/h via INTRAVENOUS
  Administered 2020-08-11: 1200 [IU]/h via INTRAVENOUS
  Filled 2020-08-10 (×2): qty 250

## 2020-08-10 MED ORDER — ATORVASTATIN CALCIUM 20 MG PO TABS
40.0000 mg | ORAL_TABLET | Freq: Every day | ORAL | Status: DC
  Administered 2020-08-11 – 2020-08-12 (×2): 40 mg via ORAL
  Filled 2020-08-10 (×2): qty 2

## 2020-08-10 MED ORDER — ASPIRIN 81 MG PO CHEW
324.0000 mg | CHEWABLE_TABLET | Freq: Once | ORAL | Status: AC
  Administered 2020-08-10: 324 mg via ORAL
  Filled 2020-08-10: qty 4

## 2020-08-10 MED ORDER — ACETAMINOPHEN 325 MG PO TABS: 650.0000 mg | ORAL_TABLET | ORAL | Status: DC | PRN

## 2020-08-10 MED ORDER — ACETAMINOPHEN 500 MG PO TABS
1000.0000 mg | ORAL_TABLET | Freq: Once | ORAL | Status: AC
  Administered 2020-08-10: 1000 mg via ORAL
  Filled 2020-08-10: qty 2

## 2020-08-10 NOTE — ED Triage Notes (Signed)
Pt arrives via ems from home. 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 Pt reports 3 stents placed in past, 2 were placed in jan 2020. Reports current pain similar to previous cardiac events. SOB with exertion. Denies dizziness, n/v. NAD noted at this time.

## 2020-08-10 NOTE — ED Provider Notes (Signed)
[facility] Emergency Department Provider Note  ____________________________________________  Time seen: Approximately 8:21 PM  I have reviewed the triage vital signs and the nursing notes.   HISTORY  Chief Complaint Chest Pain    HPI Brad Cole is a 59 y.o. male with a history of CAD and hypertension, 3 previous stents followed by cardiology Dr. Nehemiah Cole who complains of intermittent central chest pain radiating to the jaw for the past 2 weeks.  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  Associated with some mild shortness of breath and nausea but no vomiting.  Some intermittent palpitations as well.  Pain is currently 4/10.  He notes that because of pandemic concerns he has not followed up in the cardiology clinic in a year and a half, and he ran out of Plavix 3 or 4 months ago.  His last stent was placed in January 2020.  Sees Dr. Nehemiah Cole.   Past Medical History:  Diagnosis Date  . CAD (coronary artery disease)   . Hyperlipidemia   . Hypertension   . Migraine   . Tobacco abuse      Patient Active Problem List   Diagnosis Date Noted  . Lipoma of abdominal wall 09/27/2019  . NSTEMI (non-ST elevated myocardial infarction) (Brad Cole)   . Unstable angina (Brad Cole) 12/18/2018  . Hypertension 07/27/2018  . Abscess of back 07/27/2018  . Tobacco abuse 09/26/2017  . Serous otitis media 08/17/2017  . CAD (coronary artery disease) 07/23/2016  . Essential hypertension, benign 10/09/2015  . Rash of hands 10/09/2015  . Tremor 07/11/2015  . Migraine   . Hyperlipidemia      Past Surgical History:  Procedure Laterality Date  . CORONARY ANGIOPLASTY WITH STENT PLACEMENT  2014   Dr. Nehemiah Cole  . CORONARY STENT INTERVENTION N/A 12/19/2018   Procedure: CORONARY STENT INTERVENTION;  Surgeon: Nelva Bush, MD;  Location: [place] [facility];  Service: Cardiovascular;  Laterality: N/A;  .  DENTAL RESTORATION/EXTRACTION WITH X-RAY     full upper dental extraction  . LEFT HEART CATH AND CORONARY ANGIOGRAPHY N/A 12/19/2018   Procedure: LEFT HEART CATH AND CORONARY ANGIOGRAPHY;  Surgeon: Corey Skains, MD;  Location: [place] [facility];  Service: Cardiovascular;  Laterality: N/A;  . TONSILLECTOMY    . WISDOM TOOTH EXTRACTION       Prior to Admission medications   Medication Sig Start Date End Date Taking? Authorizing Provider  aspirin 81 MG chewable tablet Chew 1 tablet (81 mg total) by mouth daily. 12/21/18   Brad Jock Jude, MD  clopidogrel (PLAVIX) 75 MG tablet  08/28/19   [provider]  metoprolol tartrate (LOPRESSOR) 25 MG tablet Take 1 tablet (25 mg total) by mouth 2 (two) times daily. 12/20/18   Brad Jock Jude, MD  Multiple Vitamin (MULTIVITAMIN) tablet Take 1 tablet by mouth daily.    [provider]  neomycin-polymyxin-hydrocortisone (CORTISPORIN) OTIC solution Place 4 drops into the left ear 4 (four) times daily. 09/27/19   Brad Haddock, NP  nitroGLYCERIN (NITROSTAT) 0.4 MG SL tablet Place 1 tablet (0.4 mg total) under the tongue every 5 (five) minutes as needed for chest pain. 12/20/18   Brad Jock Jude, MD     Allergies Atorvastatin   Family History  Problem Relation Age of Onset  . Cancer Father        pancreas  . Heart disease Father   . Seizures Sister   . Cerebral palsy  Sister   . Leukemia Maternal Grandmother     Social History Social History   Tobacco Use  . Smoking status: Current Every Day Smoker    Packs/day: 0.75    Types: Cigarettes  . Smokeless tobacco: Never Used  Vaping Use  . Vaping Use: Former  Substance Use Topics  . Alcohol use: No  . Drug use: No    Review of Systems  Constitutional:   No fever or chills.  ENT:   No sore throat. No rhinorrhea. Cardiovascular:   Positive chest pain as above without syncope. Respiratory:   No dyspnea or cough. Gastrointestinal:   Negative for abdominal pain, vomiting and diarrhea.   Musculoskeletal:   Negative for focal pain or swelling All other systems reviewed and are negative except as documented above in ROS and HPI.  ____________________________________________   PHYSICAL EXAM:  VITAL SIGNS: ED Triage Vitals [08/10/20 1909]  Enc Vitals Group     BP (!) 134/92     Pulse Rate 81     Resp 13     Temp      Temp src      SpO2 97 %     Weight      Height      Head Circumference      Peak Flow      Pain Score 3     Pain Loc      Pain Edu?      Excl. in [place]?     Vital signs reviewed, nursing assessments reviewed.   Constitutional:   Alert and oriented. Non-toxic appearance. Eyes:   Conjunctivae are normal. EOMI. PERRL. ENT      Head:   Normocephalic and atraumatic.      Nose:   Wearing a mask.      Mouth/Throat:   Wearing a mask.      Neck:   No meningismus. Full ROM. Hematological/Lymphatic/Immunilogical:   No cervical lymphadenopathy. Cardiovascular:   RRR. Symmetric bilateral radial and DP pulses.  No murmurs. Cap refill less than 2 seconds. Respiratory:   Normal respiratory effort without tachypnea/retractions. Breath sounds are clear and equal bilaterally. No wheezes/rales/rhonchi. Gastrointestinal:   Soft and nontender. Non distended. There is no CVA tenderness.  No rebound, rigidity, or guarding.  Musculoskeletal:   Normal range of motion in all extremities. No joint effusions.  No lower extremity tenderness.  No edema. Neurologic:   Normal speech and language.  Motor grossly intact. No acute focal neurologic deficits are appreciated.  Skin:    Skin is warm, dry and intact. No rash noted.  No petechiae, purpura, or bullae.  ____________________________________________    LABS (pertinent positives/negatives) (all labs ordered are listed, but only abnormal results are displayed) Labs Reviewed  TROPONIN I (HIGH SENSITIVITY) - Abnormal; Notable for the following components:      Result Value   Troponin I (High Sensitivity) 3,151 (*)     All other components within normal limits  SARS CORONAVIRUS 2 BY RT PCR (HOSPITAL ORDER, [facility])  PROTIME-INR  APTT  HEPARIN LEVEL (UNFRACTIONATED)  CBC   ____________________________________________   EKG  Interpreted by me Normal sinus rhythm rate of 73, left axis, normal intervals.  Poor R wave progression.  Normal ST segments.  Very slight T wave inversions in inferolateral leads.  Repeat EKG interpreted by me Normal sinus rhythm rate of 72, no evolving ischemic changes.  ____________________________________________    ELFYBOFBP  DG Chest 2 View  Result Date: 08/10/2020  CLINICAL DATA:  Chest pain EXAM: CHEST - 2 VIEW COMPARISON:  12/20/2018 FINDINGS: Normal heart size and mediastinal contours. Coronary stenting. No acute infiltrate or edema. No effusion or pneumothorax. No acute osseous findings. IMPRESSION: No evidence of active disease. Electronically Signed   By: Monte Fantasia M.D.   On: 08/10/2020 14:19    ____________________________________________   PROCEDURES .Critical Care Performed by: Carrie Mew, MD Authorized by: Carrie Mew, MD   Critical care provider statement:    Critical care time (minutes):  35   Critical care time was exclusive of:  Separately billable procedures and treating other patients   Critical care was necessary to treat or prevent imminent or life-threatening deterioration of the following conditions:  Cardiac failure   Critical care was time spent personally by me on the following activities:  Development of treatment plan with patient or surrogate, discussions with consultants, evaluation of patient's response to treatment, examination of patient, obtaining history from patient or surrogate, ordering and performing treatments and interventions, ordering and review of laboratory studies, ordering and review of radiographic studies, pulse oximetry, re-evaluation of patient's condition and review of  old charts    ____________________________________________    CLINICAL IMPRESSION / ASSESSMENT AND PLAN / ED COURSE  Medications ordered in the ED: Medications  heparin ADULT infusion 100 units/mL (25000 units/296mL sodium chloride 0.45%) (950 Units/hr Intravenous New Bag/Given 08/10/20 1935)  nitroGLYCERIN (NITROSTAT) SL tablet 0.4 mg (has no administration in time range)  acetaminophen (TYLENOL) tablet 1,000 mg (has no administration in time range)  sodium chloride 0.9 % bolus 1,000 mL (has no administration in time range)  aspirin chewable tablet 324 mg (324 mg Oral Given 08/10/20 1928)  heparin bolus via infusion 4,000 Units (4,000 Units Intravenous Bolus from Bag 08/10/20 1936)    Pertinent labs & imaging results that were available during my care of the patient were reviewed by me and considered in my medical decision making (see chart for details).  Brad Cole was evaluated in Emergency Department on 08/10/2020 for the symptoms described in the history of present illness. He was evaluated in the context of the global COVID-19 pandemic, which necessitated consideration that the patient might be at risk for infection with the SARS-CoV-2 virus that causes COVID-19. Institutional protocols and algorithms that pertain to the evaluation of patients at risk for COVID-19 are in a state of rapid change based on information released by regulatory bodies including the CDC and federal and state organizations. These policies and algorithms were followed during the patient's care in the ED.   Patient presents with symptoms of ACS.  Doubt PE dissection pericarditis or infection.  EKG unremarkable, but troponin has trended over the past 8 hours from 80 to 850 to 3150, consistent with non-STEMI..  Current pain level is 4/10.  Will give nitroglycerin with a goal of pain resolution.  He has had aspirin.  Start heparin bolus and infusion.  Case discussed with hospitalist.       ____________________________________________   FINAL CLINICAL IMPRESSION(S) / ED DIAGNOSES    Final diagnoses:  NSTEMI (non-ST elevated myocardial infarction) ([place])  Precordial pain     ED Discharge Orders    None      Portions of this note were generated with dragon dictation software. Dictation errors may occur despite best attempts at proofreading.   Carrie Mew, MD 08/10/20 2026

## 2020-08-10 NOTE — Consult Note (Signed)
[facility] for Heparin  Indication: chest pain/ACS  Allergies  Allergen Reactions  . Atorvastatin Other (See Comments)    "makes me feel bad"    Patient Measurements:   Heparin Dosing Weight: 79.4 kg   Vital Signs: Temp: 99 F (37.2 C) (08/22 1317) Temp Source: Oral (08/22 1317) BP: 134/92 (08/22 1909) Pulse Rate: 81 (08/22 1909)  Labs: Recent Labs    08/10/20 1321 08/10/20 1735  HGB 16.2  --   HCT 45.4  --   PLT 200  --   CREATININE 0.95  --   TROPONINIHS 79* 850*    Estimated Creatinine Clearance: 86 mL/min (by C-G formula based on SCr of 0.95 mg/dL).   Medications:  Confirmed with patient no PTA anticoagulants   Assessment: Pharmacy has been consulted for heparin dosing in this patient who presents with chest pain that started around 2 weeks ago and the intensity increased today. Baseline CBC appropriate to start heparin.   Troponin 79 > 850   Goal of Therapy:  Heparin level 0.3-0.7 units/ml Monitor platelets by anticoagulation protocol: Yes   Plan:  Baseline labs have been ordered  Heparin DW: 79.4 kg  Give 4000 units bolus x 1 Start heparin infusion at 950 units/hr Check anti-Xa level in 6 hours and daily while on heparin, per protocol Continue to monitor H&H and platelets   Mikeisha Lemonds R Boyd Litaker 08/10/2020,7:28 PM

## 2020-08-10 NOTE — ED Triage Notes (Signed)
Pt presents via POV c/o substernal chest pain at apx 1130 today. Baby ASA given x5 per EMS. Has cardiac hx.

## 2020-08-10 NOTE — ED Notes (Signed)
Called and left pt a VM to return to ED due to elevated troponin. Brandy RN aware.

## 2020-08-10 NOTE — ED Triage Notes (Signed)
Pt presents via POV back to ED with elevated troponin. Repeat EKG performed.

## 2020-08-10 NOTE — ED Notes (Signed)
2nd troponin drawn and sent to lab per order. Pt requesting to leave and will follow-up with cardiologist on Wednesday per pt report.

## 2020-08-10 NOTE — ED Notes (Signed)
Pt notified of elevated troponin but insistent on leaving. Brandy RN made aware.

## 2020-08-10 NOTE — H&P (Signed)
History and Physical    Brad Cole DVV:[id number] DOB: [birth year] DOA: 08/10/2020  PCP: Guadalupe Maple, MD   Patient coming from: Home  I have personally briefly reviewed patient's old medical records in [place]  Chief Complaint: Chest pain  HPI: Brad Cole is a 59 y.o. male with medical history significant for CAD with history of stent angioplasty, lost to follow-up during the pandemic who presented to the emergency room earlier in the day with chest pain intermittent for the past 4 weeks described as tightness in the retrosternal and precordial areas sometimes radiating to the jaw and to the elbow, related to exertion and relieved with rest but with no associated nausea vomiting diaphoresis palpitations lightheadedness or shortness of breath which got acutely worse in the past 24 hours.  Patient was seen in the emergency room but due to prolonged wait decided to go home before his work-up was completed.  He was called back to the emergency room due to troponin bump from an initial troponin of 79-850.  Patient was still having mild pain of about 4 out of 10 when he left the emergency room ED Course: On his return to the emergency room troponin was repeated and had gone up to 3151.  He had no acute EKG changes.  He was started on IV heparin, given aspirin.  Hospitalist consulted for admission.  Review of Systems: As per HPI otherwise all other systems on review of systems negative.    Past Medical History:  Diagnosis Date  . CAD (coronary artery disease)   . Hyperlipidemia   . Hypertension   . Migraine   . Tobacco abuse     Past Surgical History:  Procedure Laterality Date  . CORONARY ANGIOPLASTY WITH STENT PLACEMENT  2014   Dr. Nehemiah Massed  . CORONARY STENT INTERVENTION N/A 12/19/2018   Procedure: CORONARY STENT INTERVENTION;  Surgeon: Nelva Bush, MD;  Location: [facility];  Service: Cardiovascular;  Laterality: N/A;  . DENTAL  RESTORATION/EXTRACTION WITH X-RAY     full upper dental extraction  . LEFT HEART CATH AND CORONARY ANGIOGRAPHY N/A 12/19/2018   Procedure: LEFT HEART CATH AND CORONARY ANGIOGRAPHY;  Surgeon: Corey Skains, MD;  Location: [facility];  Service: Cardiovascular;  Laterality: N/A;  . TONSILLECTOMY    . WISDOM TOOTH EXTRACTION       reports that he has been smoking cigarettes. He has been smoking about 0.75 packs per day. He has never used smokeless tobacco. He reports that he does not drink alcohol and does not use drugs.  Allergies  Allergen Reactions  . Atorvastatin Other (See Comments)    "makes me feel bad"    Family History  Problem Relation Age of Onset  . Cancer Father        pancreas  . Heart disease Father   . Seizures Sister   . Cerebral palsy Sister   . Leukemia Maternal Grandmother       Prior to Admission medications   Medication Sig Start Date End Date Taking? Authorizing Provider  aspirin 81 MG chewable tablet Chew 1 tablet (81 mg total) by mouth daily. 12/21/18   Stark Jock Jude, MD  clopidogrel (PLAVIX) 75 MG tablet  08/28/19   [provider]  metoprolol tartrate (LOPRESSOR) 25 MG tablet Take 1 tablet (25 mg total) by mouth 2 (two) times daily. 12/20/18   Stark Jock Jude, MD  Multiple Vitamin (MULTIVITAMIN) tablet Take 1 tablet by mouth daily.    [provider]  nitroGLYCERIN (NITROSTAT) 0.4 MG SL tablet Place 1 tablet (0.4 mg total) under the tongue every 5 (five) minutes as needed for chest pain. 12/20/18   Otila Back, MD    Physical Exam: Vitals:   08/10/20 1909  BP: (!) 134/92  Pulse: 81  Resp: 13  SpO2: 97%     Vitals:   08/10/20 1909  BP: (!) 134/92  Pulse: 81  Resp: 13  SpO2: 97%      Constitutional: Alert and oriented x 3 . Not in any apparent distress HEENT:      Head: Normocephalic and atraumatic.         Eyes: PERLA, EOMI, Conjunctivae are normal. Sclera is non-icteric.       Mouth/Throat: Mucous membranes are moist.         Neck: Supple with no signs of meningismus. Cardiovascular: Regular rate and rhythm. No murmurs, gallops, or rubs. 2+ symmetrical distal pulses are present . No JVD. No LE edema Respiratory: Respiratory effort normal .Lungs sounds clear bilaterally. No wheezes, crackles, or rhonchi.  Gastrointestinal: Soft, non tender, and non distended with positive bowel sounds. No rebound or guarding. Genitourinary: No CVA tenderness. Musculoskeletal: Nontender with normal range of motion in all extremities. No cyanosis, or erythema of extremities. Neurologic: Normal speech and language. Face is symmetric. Moving all extremities. No gross focal neurologic deficits . Skin: Skin is warm, dry.  No rash or ulcers Psychiatric: Mood and affect are normal Speech and behavior are normal   Labs on Admission: I have personally reviewed following labs and imaging studies  CBC: Recent Labs  Lab 08/10/20 1321  WBC 7.2  HGB 16.2  HCT 45.4  MCV 95.8  PLT 235   Basic Metabolic Panel: Recent Labs  Lab 08/10/20 1321  NA 139  K 3.9  CL 109  CO2 19*  GLUCOSE 96  BUN 19  CREATININE 0.95  CALCIUM 8.7*   GFR: Estimated Creatinine Clearance: 86 mL/min (by C-G formula based on SCr of 0.95 mg/dL). Liver Function Tests: No results for input(s): AST, ALT, ALKPHOS, BILITOT, PROT, ALBUMIN in the last 168 hours. No results for input(s): LIPASE, AMYLASE in the last 168 hours. No results for input(s): AMMONIA in the last 168 hours. Coagulation Profile: Recent Labs  Lab 08/10/20 1926  INR 1.1   Cardiac Enzymes: No results for input(s): CKTOTAL, CKMB, CKMBINDEX, TROPONINI in the last 168 hours. BNP (last 3 results) No results for input(s): PROBNP in the last 8760 hours. HbA1C: No results for input(s): HGBA1C in the last 72 hours. CBG: No results for input(s): GLUCAP in the last 168 hours. Lipid Profile: No results for input(s): CHOL, HDL, LDLCALC, TRIG, CHOLHDL, LDLDIRECT in the last 72 hours. Thyroid  Function Tests: No results for input(s): TSH, T4TOTAL, FREET4, T3FREE, THYROIDAB in the last 72 hours. Anemia Panel: No results for input(s): VITAMINB12, FOLATE, FERRITIN, TIBC, IRON, RETICCTPCT in the last 72 hours. Urine analysis:    Component Value Date/Time   COLORURINE Yellow 06/18/2013 1107   APPEARANCEUR Clear 06/18/2013 1107   LABSPEC 1.014 06/18/2013 1107   PHURINE 5.0 06/18/2013 1107   GLUCOSEU Negative 06/18/2013 1107   HGBUR Negative 06/18/2013 1107   BILIRUBINUR Negative 06/18/2013 1107   KETONESUR Negative 06/18/2013 1107   PROTEINUR Negative 06/18/2013 1107   NITRITE Negative 06/18/2013 1107   LEUKOCYTESUR Negative 06/18/2013 1107    Radiological Exams on Admission: DG Chest 2 View  Result Date: 08/10/2020 CLINICAL DATA:  Chest pain EXAM: CHEST - 2 VIEW  COMPARISON:  12/20/2018 FINDINGS: Normal heart size and mediastinal contours. Coronary stenting. No acute infiltrate or edema. No effusion or pneumothorax. No acute osseous findings. IMPRESSION: No evidence of active disease. Electronically Signed   By: Monte Fantasia M.D.   On: 08/10/2020 14:19    EKG: Independently reviewed. Interpretation : Normal sinus rhythm with poor R wave progression and T wave inversion in lateral leads  Assessment/Plan 59 year old male with history of CAD with history of DES to RCA in January 2020, lost to follow-up during the pandemic presenting with 4 weeks of  exertional chest pain becoming acutely worse in recent days who presented to the emergency room earlier in the day with chest pain intermittent for the past 4 weeks     NSTEMI (non-ST elevated myocardial infarction) ([place])   CAD (coronary artery disease) -History of PCI-DES to RCA January 2020.  Ran out of Plavix several months prior -Exertional chest pain radiating to the jaw and left arm -Troponin 79>>850>>3151.  EKG with T wave inversions lateral leads -Patient received aspirin in the emergency room -Continue heparin infusion,  aspirin, beta-blocker, statin -Cardiology consult    Essential hypertension, benign -Metoprolol as above    DVT prophylaxis: Lovenox  Code Status: full code  Family Communication:  none  Disposition Plan: Back to previous home environment Consults called: Cardiology  Status:At the time of admission, it appears that the appropriate admission status for this patient is INPATIENT. This is judged to be reasonable and necessary in order to provide the required intensity of service to ensure the patient's safety given the presenting symptoms, physical exam findings, and initial radiographic and laboratory data in the context of their  Comorbid conditions.   Patient requires inpatient status due to high intensity of service, high risk for further deterioration and high frequency of surveillance required.   I certify that at the point of admission it is my clinical judgment that the patient will require inpatient hospital care spanning beyond [place] MD [facility]     08/10/2020, 8:25 PM

## 2020-08-11 ENCOUNTER — Encounter
Admission: EM | Disposition: A | Discharge status: Home / Self Care | Payer: Self-pay | Source: Home / Self Care | Attending: Family Medicine

## 2020-08-11 ENCOUNTER — Other Ambulatory Visit: Payer: Self-pay

## 2020-08-11 ENCOUNTER — Encounter: Payer: Self-pay | Admitting: Internal Medicine

## 2020-08-11 DIAGNOSIS — I10 Essential (primary) hypertension: Secondary | ICD-10-CM

## 2020-08-11 DIAGNOSIS — I2510 Atherosclerotic heart disease of native coronary artery without angina pectoris: Secondary | ICD-10-CM

## 2020-08-11 HISTORY — PX: LEFT HEART CATH AND CORONARY ANGIOGRAPHY: CATH118249

## 2020-08-11 HISTORY — PX: CORONARY STENT INTERVENTION: CATH118234

## 2020-08-11 LAB — BASIC METABOLIC PANEL WITH GFR
Anion gap: 11 (ref 5–15)
BUN: 17 mg/dL (ref 6–20)
CO2: 23 mmol/L (ref 22–32)
Calcium: 8.1 mg/dL — ABNORMAL LOW (ref 8.9–10.3)
Chloride: 107 mmol/L (ref 98–111)
Creatinine, Ser: 1.02 mg/dL (ref 0.61–1.24)
GFR calc Af Amer: >60 mL/min
GFR calc non Af Amer: >60 mL/min
Glucose, Bld: 100 mg/dL — ABNORMAL HIGH (ref 70–99)
Potassium: 3.9 mmol/L (ref 3.5–5.1)
Sodium: 141 mmol/L (ref 135–145)

## 2020-08-11 LAB — HIV ANTIBODY (ROUTINE TESTING W REFLEX): HIV Screen 4th Generation wRfx: NONREACTIVE

## 2020-08-11 LAB — URINALYSIS, COMPLETE (UACMP) WITH MICROSCOPIC
Bacteria, UA: NONE SEEN
Bilirubin Urine: NEGATIVE
Glucose, UA: NEGATIVE mg/dL
Ketones, ur: 20 mg/dL — AB
Leukocytes,Ua: NEGATIVE
Nitrite: NEGATIVE
Protein, ur: 30 mg/dL — AB
RBC / HPF: >50 RBC/hpf — ABNORMAL HIGH (ref 0–5)
Specific Gravity, Urine: 1.028 (ref 1.005–1.030)
pH: 5 (ref 5.0–8.0)

## 2020-08-11 LAB — LIPID PANEL
Cholesterol: 253 mg/dL — ABNORMAL HIGH (ref 0–200)
HDL: 33 mg/dL — ABNORMAL LOW
LDL Cholesterol: 201 mg/dL — ABNORMAL HIGH (ref 0–99)
Total CHOL/HDL Ratio: 7.7 ratio
Triglycerides: 95 mg/dL
VLDL: 19 mg/dL (ref 0–40)

## 2020-08-11 LAB — CBC
HCT: 39.3 % (ref 39.0–52.0)
Hemoglobin: 14.1 g/dL (ref 13.0–17.0)
MCH: 34.5 pg — ABNORMAL HIGH (ref 26.0–34.0)
MCHC: 35.9 g/dL (ref 30.0–36.0)
MCV: 96.1 fL (ref 80.0–100.0)
Platelets: 174 10*3/uL (ref 150–400)
RBC: 4.09 MIL/uL — ABNORMAL LOW (ref 4.22–5.81)
RDW: 12.4 % (ref 11.5–15.5)
WBC: 8.1 10*3/uL (ref 4.0–10.5)
nRBC: 0 % (ref 0.0–0.2)

## 2020-08-11 LAB — HEPARIN LEVEL (UNFRACTIONATED): Heparin Unfractionated: 0.24 [IU]/mL — ABNORMAL LOW (ref 0.30–0.70)

## 2020-08-11 LAB — POCT ACTIVATED CLOTTING TIME
Activated Clotting Time: 329 seconds
Activated Clotting Time: 340 seconds

## 2020-08-11 LAB — TROPONIN I (HIGH SENSITIVITY): Troponin I (High Sensitivity): 13719 ng/L

## 2020-08-11 SURGERY — LEFT HEART CATH AND CORONARY ANGIOGRAPHY
Anesthesia: Moderate Sedation

## 2020-08-11 MED ORDER — ASPIRIN 81 MG PO CHEW: 81.0000 mg | CHEWABLE_TABLET | ORAL | Status: DC

## 2020-08-11 MED ORDER — HEPARIN SODIUM (PORCINE) 1000 UNIT/ML IJ SOLN
INTRAMUSCULAR | Status: AC
  Filled 2020-08-11: qty 1

## 2020-08-11 MED ORDER — MORPHINE SULFATE (PF) 4 MG/ML IV SOLN
4.0000 mg | INTRAVENOUS | Status: DC | PRN
  Administered 2020-08-11: 4 mg via INTRAVENOUS
  Filled 2020-08-11: qty 1

## 2020-08-11 MED ORDER — SODIUM CHLORIDE 0.9 % IV SOLN: 250.0000 mL | INTRAVENOUS | Status: DC | PRN

## 2020-08-11 MED ORDER — CLOPIDOGREL BISULFATE 75 MG PO TABS
ORAL_TABLET | ORAL | Status: DC | PRN
  Administered 2020-08-11: 600 mg via ORAL

## 2020-08-11 MED ORDER — SODIUM CHLORIDE 0.9% FLUSH: 3.0000 mL | INTRAVENOUS | Status: DC | PRN

## 2020-08-11 MED ORDER — TIROFIBAN HCL IV 12.5 MG/250 ML
INTRAVENOUS | Status: AC | PRN
  Administered 2020-08-11: 0.15 ug/kg/min via INTRAVENOUS

## 2020-08-11 MED ORDER — FENTANYL CITRATE (PF) 100 MCG/2ML IJ SOLN
INTRAMUSCULAR | Status: AC
  Filled 2020-08-11: qty 2

## 2020-08-11 MED ORDER — ASPIRIN 81 MG PO CHEW: 81.0000 mg | CHEWABLE_TABLET | Freq: Every day | ORAL | Status: DC

## 2020-08-11 MED ORDER — ONDANSETRON HCL 4 MG/2ML IJ SOLN
INTRAMUSCULAR | Status: AC
  Filled 2020-08-11: qty 2

## 2020-08-11 MED ORDER — SODIUM CHLORIDE 0.9% FLUSH
3.0000 mL | Freq: Two times a day (BID) | INTRAVENOUS | Status: DC
  Administered 2020-08-12: 3 mL via INTRAVENOUS

## 2020-08-11 MED ORDER — TIROFIBAN HCL IV 12.5 MG/250 ML
INTRAVENOUS | Status: AC
  Filled 2020-08-11: qty 250

## 2020-08-11 MED ORDER — HEPARIN (PORCINE) IN NACL 1000-0.9 UT/500ML-% IV SOLN
INTRAVENOUS | Status: DC | PRN
  Administered 2020-08-11 (×2): 500 mL

## 2020-08-11 MED ORDER — SODIUM CHLORIDE 0.9 % WEIGHT BASED INFUSION
1.0000 mL/kg/h | INTRAVENOUS | Status: DC
  Administered 2020-08-11: 1 mL/kg/h via INTRAVENOUS

## 2020-08-11 MED ORDER — MIDAZOLAM HCL 2 MG/2ML IJ SOLN
INTRAMUSCULAR | Status: AC
  Filled 2020-08-11: qty 2

## 2020-08-11 MED ORDER — TIROFIBAN HCL IN NACL 5-0.9 MG/100ML-% IV SOLN
0.1500 ug/kg/min | INTRAVENOUS | Status: AC
  Administered 2020-08-11 – 2020-08-12 (×2): 0.15 ug/kg/min via INTRAVENOUS
  Filled 2020-08-11 (×4): qty 100

## 2020-08-11 MED ORDER — LIDOCAINE HCL (PF) 1 % IJ SOLN
INTRAMUSCULAR | Status: AC
  Filled 2020-08-11: qty 30

## 2020-08-11 MED ORDER — VERAPAMIL HCL 2.5 MG/ML IV SOLN
INTRAVENOUS | Status: AC
  Filled 2020-08-11: qty 2

## 2020-08-11 MED ORDER — CLOPIDOGREL BISULFATE 75 MG PO TABS
75.0000 mg | ORAL_TABLET | Freq: Every day | ORAL | Status: DC
  Administered 2020-08-12: 75 mg via ORAL
  Filled 2020-08-11: qty 1

## 2020-08-11 MED ORDER — TIROFIBAN (AGGRASTAT) BOLUS VIA INFUSION
INTRAVENOUS | Status: DC | PRN
  Administered 2020-08-11: 1985 ug via INTRAVENOUS

## 2020-08-11 MED ORDER — LABETALOL HCL 5 MG/ML IV SOLN: 10.0000 mg | INTRAVENOUS | Status: AC | PRN

## 2020-08-11 MED ORDER — MELATONIN 5 MG PO TABS
5.0000 mg | ORAL_TABLET | Freq: Every day | ORAL | Status: DC
  Administered 2020-08-11: 5 mg via ORAL
  Filled 2020-08-11: qty 1

## 2020-08-11 MED ORDER — SODIUM CHLORIDE 0.9 % WEIGHT BASED INFUSION: 1.0000 mL/kg/h | INTRAVENOUS | Status: AC

## 2020-08-11 MED ORDER — NITROGLYCERIN 1 MG/10 ML FOR IR/CATH LAB
INTRA_ARTERIAL | Status: AC
  Filled 2020-08-11: qty 10

## 2020-08-11 MED ORDER — ACETAMINOPHEN 325 MG PO TABS
650.0000 mg | ORAL_TABLET | ORAL | Status: DC | PRN
  Administered 2020-08-12: 650 mg via ORAL
  Filled 2020-08-11: qty 2

## 2020-08-11 MED ORDER — IOHEXOL 300 MG/ML  SOLN
INTRAMUSCULAR | Status: DC | PRN
  Administered 2020-08-11: 195 mL

## 2020-08-11 MED ORDER — ONDANSETRON HCL 4 MG/2ML IJ SOLN
4.0000 mg | Freq: Four times a day (QID) | INTRAMUSCULAR | Status: DC | PRN
  Administered 2020-08-11: 4 mg via INTRAVENOUS

## 2020-08-11 MED ORDER — HYDRALAZINE HCL 20 MG/ML IJ SOLN: 10.0000 mg | INTRAMUSCULAR | Status: AC | PRN

## 2020-08-11 MED ORDER — LIDOCAINE HCL (PF) 1 % IJ SOLN
INTRAMUSCULAR | Status: DC | PRN
  Administered 2020-08-11: 2 mL

## 2020-08-11 MED ORDER — VERAPAMIL HCL 2.5 MG/ML IV SOLN
INTRAVENOUS | Status: DC | PRN
  Administered 2020-08-11: 2.5 mg via INTRAVENOUS

## 2020-08-11 MED ORDER — MIDAZOLAM HCL 2 MG/2ML IJ SOLN
INTRAMUSCULAR | Status: DC | PRN
  Administered 2020-08-11 (×3): 1 mg via INTRAVENOUS

## 2020-08-11 MED ORDER — FENTANYL CITRATE (PF) 100 MCG/2ML IJ SOLN
INTRAMUSCULAR | Status: DC | PRN
  Administered 2020-08-11 (×3): 25 ug via INTRAVENOUS

## 2020-08-11 MED ORDER — SODIUM CHLORIDE 0.9 % WEIGHT BASED INFUSION: 3.0000 mL/kg/h | INTRAVENOUS | Status: AC

## 2020-08-11 MED ORDER — HEPARIN (PORCINE) IN NACL 1000-0.9 UT/500ML-% IV SOLN
INTRAVENOUS | Status: AC
  Filled 2020-08-11: qty 1000

## 2020-08-11 MED ORDER — CLOPIDOGREL BISULFATE 75 MG PO TABS
ORAL_TABLET | ORAL | Status: AC
  Filled 2020-08-11: qty 8

## 2020-08-11 MED ORDER — HEPARIN SODIUM (PORCINE) 1000 UNIT/ML IJ SOLN
INTRAMUSCULAR | Status: DC | PRN
  Administered 2020-08-11: 4000 [IU] via INTRAVENOUS
  Administered 2020-08-11: 8000 [IU] via INTRAVENOUS

## 2020-08-11 SURGICAL SUPPLY — 19 items
BALLN TREK RX 2.25X15 (BALLOONS) ×3
BALLOON TREK RX 2.25X15 (BALLOONS) ×1 IMPLANT
CATH 5F 110X4 TIG (CATHETERS) ×3 IMPLANT
CATH INFINITI 5FR ANG PIGTAIL (CATHETERS) ×3 IMPLANT
CATH INFINITI JR4 5F (CATHETERS) ×3 IMPLANT
CATH VISTA GUIDE 6FR JL3.5 (CATHETERS) ×3 IMPLANT
CATH VISTA GUIDE 6FR XB3.5 (CATHETERS) ×3 IMPLANT
DEVICE INFLAT 30 PLUS (MISCELLANEOUS) ×3 IMPLANT
DEVICE RAD TR BAND REGULAR (VASCULAR PRODUCTS) ×3 IMPLANT
GLIDESHEATH SLEND SS 6F .021 (SHEATH) ×3 IMPLANT
GUIDEWIRE INQWIRE 1.5J.035X260 (WIRE) ×1 IMPLANT
INQWIRE 1.5J .035X260CM (WIRE) ×3
KIT MANI 3VAL PERCEP (MISCELLANEOUS) ×3 IMPLANT
PACK CARDIAC CATH (CUSTOM PROCEDURE TRAY) ×3 IMPLANT
STENT RESOLUTE ONYX 3.0X15 (Permanent Stent) ×3 IMPLANT
STENT RESOLUTE ONYX 3.0X30 (Permanent Stent) ×3 IMPLANT
WIRE ASAHI PROWATER 180CM (WIRE) ×3 IMPLANT
WIRE HI TORQ WHISPER MS 190CM (WIRE) ×3 IMPLANT
WIRE RUNTHROUGH .014X180CM (WIRE) ×3 IMPLANT

## 2020-08-11 NOTE — ED Notes (Signed)
Bladder scan 83 ml

## 2020-08-11 NOTE — ED Notes (Signed)
Patient with complaint of right lower back pain that is new. Dr. Damita Dunnings notified.

## 2020-08-11 NOTE — Consult Note (Signed)
[facility] Cardiology  CARDIOLOGY CONSULT NOTE  Patient ID: Brad Cole MRN: [id number] DOB/AGE: 1961/12/06 59 y.o.  Admit date: 08/10/2020 Referring Physician Darrick Meigs Primary Physician [facility] Primary Cardiologist Nehemiah Massed Reason for Consultation non-ST elevation myocardial infarction  HPI: 59 year old gentleman referred for evaluation of non-ST elevation myocardial infarction.  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  EMS was called, the patient was brought to [facility] emergency room where ECG revealed sinus rhythm with lateral T wave inversions.  The patient has ruled in for non-ST elevation myocardial infarction with elevated high-sensitivity troponin  850, 3151, 5570, 13,919.  Patient reported his chest pain was 10 out of 10.  Today he reports chest pain is 3-4 out of 10.  Patient has known coronary artery disease, status post stent mid LAD 2014.  The patient underwent repeat cardiac catheterization 12/19/2018 following non-STEMI and required PCI receiving 3.0 x 38 mm resolute Onyx mid RCA and 2.75 x 15 mm Resolute Onyx distal RCA.  Review of systems complete and found to be negative unless listed above     Past Medical History:  Diagnosis Date  . CAD (coronary artery disease)   . Hyperlipidemia   . Hypertension   . Migraine   . Tobacco abuse     Past Surgical History:  Procedure Laterality Date  . CORONARY ANGIOPLASTY WITH STENT PLACEMENT  2014   Dr. Nehemiah Massed  . CORONARY STENT INTERVENTION N/A 12/19/2018   Procedure: CORONARY STENT INTERVENTION;  Surgeon: Nelva Bush, MD;  Location: [facility];  Service: Cardiovascular;  Laterality: N/A;  . DENTAL RESTORATION/EXTRACTION WITH X-RAY     full upper dental extraction  . LEFT HEART CATH AND CORONARY ANGIOGRAPHY N/A 12/19/2018   Procedure: LEFT HEART CATH AND CORONARY ANGIOGRAPHY;  Surgeon:  Corey Skains, MD;  Location: [facility];  Service: Cardiovascular;  Laterality: N/A;  . TONSILLECTOMY    . WISDOM TOOTH EXTRACTION      (Not in a hospital admission)  Social History   Socioeconomic History  . Marital status: Married    Spouse name: Amy  . Number of children: 1  . Years of education: Not on file  . Highest education level: Not on file  Occupational History  . Occupation: Pastor   Tobacco Use  . Smoking status: Current Every Day Smoker    Packs/day: 0.75    Types: Cigarettes  . Smokeless tobacco: Never Used  Vaping Use  . Vaping Use: Former  Substance and Sexual Activity  . Alcohol use: No  . Drug use: No  . Sexual activity: Yes  Other Topics Concern  . Not on file  Social History Narrative  . Not on file   Social Determinants of Health   Financial Resource Strain:   . Difficulty of Paying Living Expenses: Not on file  Food Insecurity:   . Worried About Charity fundraiser in the Last Year: Not on file  . Ran Out of Food in the Last Year: Not on file  Transportation Needs:   . Lack of Transportation (Medical): Not on file  . Lack of Transportation (Non-Medical): Not on file  Physical Activity:   . Days of Exercise per Week: Not on file  . Minutes of Exercise per Session: Not on file  Stress:   . Feeling of Stress : Not on file  Social Connections:   . Frequency of  Communication with Friends and Family: Not on file  . Frequency of Social Gatherings with Friends and Family: Not on file  . Attends Religious Services: Not on file  . Active Member of Clubs or Organizations: Not on file  . Attends Archivist Meetings: Not on file  . Marital Status: Not on file  Intimate Partner Violence:   . Fear of Current or Ex-Partner: Not on file  . Emotionally Abused: Not on file  . Physically Abused: Not on file  . Sexually Abused: Not on file    Family History  Problem Relation Age of Onset  . Cancer Father        pancreas  .  Heart disease Father   . Seizures Sister   . Cerebral palsy Sister   . Leukemia Maternal Grandmother       Review of systems complete and found to be negative unless listed above      PHYSICAL EXAM  General: Well developed, well nourished, in no acute distress HEENT:  Normocephalic and atramatic Neck:  No JVD.  Lungs: Clear bilaterally to auscultation and percussion. Heart: HRRR . Normal S1 and S2 without gallops or murmurs.  Abdomen: Bowel sounds are positive, abdomen soft and non-tender  Msk:  Back normal, normal gait. Normal strength and tone for age. Extremities: No clubbing, cyanosis or edema.   Neuro: Alert and oriented X 3. Psych:  Good affect, responds appropriately  Labs:   Lab Results  Component Value Date   WBC 8.1 08/11/2020   HGB 14.1 08/11/2020   HCT 39.3 08/11/2020   MCV 96.1 08/11/2020   PLT 174 08/11/2020    Recent Labs  Lab 08/11/20 0331  NA 141  K 3.9  CL 107  CO2 23  BUN 17  CREATININE 1.02  CALCIUM 8.1*  GLUCOSE 100*   Lab Results  Component Value Date   CKMB < 0.5 (L) 12/26/2012   TROPONINI 0.15 (HH) 12/19/2018    Lab Results  Component Value Date   CHOL 253 (H) 08/11/2020   CHOL 158 08/17/2017   CHOL 258 (H) 07/05/2017   Lab Results  Component Value Date   HDL 33 (L) 08/11/2020   HDL 42 07/05/2017   HDL 35 (L) 07/11/2015   Lab Results  Component Value Date   LDLCALC 201 (H) 08/11/2020   LDLCALC 202 (H) 07/05/2017   LDLCALC 199 (H) 07/11/2015   Lab Results  Component Value Date   TRIG 95 08/11/2020   TRIG 73 08/17/2017   TRIG 72 07/05/2017   Lab Results  Component Value Date   CHOLHDL 7.7 08/11/2020   No results found for: LDLDIRECT    Radiology: DG Chest 2 View  Result Date: 08/10/2020 CLINICAL DATA:  Chest pain EXAM: CHEST - 2 VIEW COMPARISON:  12/20/2018 FINDINGS: Normal heart size and mediastinal contours. Coronary stenting. No acute infiltrate or edema. No effusion or pneumothorax. No acute osseous  findings. IMPRESSION: No evidence of active disease. Electronically Signed   By: Monte Fantasia M.D.   On: 08/10/2020 14:19    EKG: Sinus rhythm with T wave inversions laterally  ASSESSMENT AND PLAN:   1.  Non-ST elevation myocardial infarction, ongoing mild chest pain 2.  Coronary artery disease with prior stent mid LAD, sequential stents mid and distal RCA  Recommendations  1.  Agree with current therapy 2.  Continue heparin drip 3.  Cardiac catheterization with selective coronary arteriography.  The risk, benefits and alternatives of cardiac catheterization were explained with the  patient and informed written consent was obtained.  Signed: Isaias Cowman MD,PhD, [facility] 08/11/2020, 8:20 AM

## 2020-08-11 NOTE — Progress Notes (Signed)
Patient to specials for cardiac cath.

## 2020-08-11 NOTE — Consult Note (Signed)
[facility] for Heparin  Indication: chest pain/ACS  Allergies  Allergen Reactions  . Atorvastatin Other (See Comments)    "makes me feel bad"   Patient Measurements:   Heparin Dosing Weight: 79.4 kg   Vital Signs: BP: 110/77 (08/23 0330) Pulse Rate: 64 (08/23 0330)  Labs: Recent Labs    08/10/20 1321 08/10/20 1321 08/10/20 1735 08/10/20 1926 08/10/20 2130 08/11/20 0331  HGB 16.2  --   --   --   --  14.1  HCT 45.4  --   --   --   --  39.3  PLT 200  --   --   --   --  174  APTT  --   --   --  28  --   --   LABPROT  --   --   --  13.4  --   --   INR  --   --   --  1.1  --   --   HEPARINUNFRC  --   --   --   --   --  0.24*  CREATININE 0.95  --   --   --   --   --   TROPONINIHS 79*   < > 850* 3,151* 5,570*  --    < > = values in this interval not displayed.    Estimated Creatinine Clearance: 86 mL/min (by C-G formula based on SCr of 0.95 mg/dL).   Medications:  Confirmed with patient no PTA anticoagulants   Assessment: Pharmacy has been consulted for heparin dosing in this patient who presents with chest pain that started around 2 weeks ago and the intensity increased today. Baseline CBC appropriate to start heparin.   Troponin 79 > 850   Goal of Therapy:  Heparin level 0.3-0.7 units/ml Monitor platelets by anticoagulation protocol: Yes   Plan:  Baseline labs have been ordered  Heparin DW: 79.4 kg  Give 4000 units bolus x 1 Start heparin infusion at 950 units/hr Check anti-Xa level in 6 hours and daily while on heparin, per protocol Continue to monitor H&H and platelets  0823 0331 HL 0.24 subtherapeutic, CBC stable.  Will increase Heparin drip to 1200 units/hr and recheck HL in 6 hours.   [place] Crane, Lanecia Sliva A 08/11/2020,4:27 AM

## 2020-08-11 NOTE — ED Notes (Signed)
Patient states that nausea has improved

## 2020-08-11 NOTE — Progress Notes (Addendum)
[facility]  PROGRESS NOTE  Brad Cole KPT:[id number] DOB: February 02, 1961 DOA: 08/10/2020 PCP: Guadalupe Maple, MD   Brief HPI:   59 year old male with a history of CAD, hypertension, cardiac stents x3 followed by cardiology Dr. Nehemiah Massed complaint of intermittent central chest pain with radiation to jaw for the past 2 weeks.  Patient says that he ran out of Plavix 3 months ago.  His last stent was placed in January 2020. Patient was found to have NSTEMI, started on heparin infusion, metoprolol, aspirin, atorvastatin.  Cardiology was consulted.    Subjective   Patient seen and examined, continues to have intermittent left-sided chest pain with radiation to jaw.  Also complains of right flank pain.  UA shows greater than 50 RBCs per high-power field.   Assessment/Plan:     1. NSTEMI-patient presented with NSTEMI, troponin went from 80- 850- 3150-5570-13,719 this morning.  EKG shows T wave inversions in leads V4, V5, V6 and lead I consistent with lateral wall ischemia/infarction.  Patient is currently on IV heparin, aspirin, atorvastatin, metoprolol.  I called and discussed with [facility] cardiology on-call Dr Saralyn Pilar.  He will see patient shortly. 2. CAD s/p stent placement-patient has history of CAD with 3 stents placed.  He had 2 stents placed in mid and distal RCA last year.  Continue metoprolol, aspirin, atorvastatin.  Patient states that he ran out of Plavix 3 months ago and did not follow up with cardiology. 3. Hypertension-continue metoprolol 4. Hematuria-patient has significant hematuria with UA showing greater than 50 RBCs per high-power field.  Also has right flank pain.  He will need CT stone protocol during this hospitalization after evaluation by cardiology.  Will hold off on CT scanning at this time considering NSTEMI and worsening troponin.  Patient is already on morphine 4 mg IV every 2 hours as needed for pain.     Lab Results  Component Value Date   [place]  NEGATIVE 08/10/2020     Scheduled medications:    aspirin EC  81 mg Oral Daily   atorvastatin  40 mg Oral Daily   metoprolol tartrate  12.5 mg Oral BID      SpO2: 97 %    CBC: Recent Labs  Lab 08/10/20 1321 08/11/20 0331  WBC 7.2 8.1  HGB 16.2 14.1  HCT 45.4 39.3  MCV 95.8 96.1  PLT 200 170    Basic Metabolic Panel: Recent Labs  Lab 08/10/20 1321 08/11/20 0331  NA 139 141  K 3.9 3.9  CL 109 107  CO2 19* 23  GLUCOSE 96 100*  BUN 19 17  CREATININE 0.95 1.02  CALCIUM 8.7* 8.1*     Liver Function Tests: No results for input(s): AST, ALT, ALKPHOS, BILITOT, PROT, ALBUMIN in the last 168 hours.   Antibiotics: Anti-infectives (From admission, onward)   None       DVT prophylaxis: Heparin  Code Status: Full code  Family Communication: No family at bedside    Status is: Inpatient  Dispo: The patient is from: Home              Anticipated d/c is to: Home              Anticipated d/c date is: 08/13/2020              Patient currently not medically stable for discharge  Barrier to discharge-NSTEMI      Consultants:  Cardiology  Procedures:     Objective   Vitals:   08/11/20 0330  08/11/20 0400 08/11/20 0600 08/11/20 0630  BP: 110/77 107/86 93/66 115/83  Pulse: 64     Resp: 18 18    SpO2:        Physical Examination:    General: Appears in no acute distress  Cardiovascular: S1-S2, regular, no murmur auscultated  Respiratory: Clear to auscultation bilaterally, no wheezing crackles auscultated  Abdomen: Abdomen is soft, mild tenderness in right lumbar region, no CVA tenderness  Extremities: No edema in the lower extremities  Neurologic: Alert , Oriented x3, no focal deficit noted.    Data Reviewed:   Recent Results (from the past 240 hour(s))  SARS Coronavirus 2 by RT PCR (hospital order, performed in [facility] hospital lab) Nasopharyngeal Nasopharyngeal Swab     Status: None   Collection Time: 08/10/20  7:26 PM    Specimen: Nasopharyngeal Swab  Result Value Ref Range Status   SARS Coronavirus 2 NEGATIVE NEGATIVE Final    Comment: (NOTE) SARS-CoV-2 target nucleic acids are NOT DETECTED.  The SARS-CoV-2 RNA is generally detectable in upper and lower respiratory specimens during the acute phase of infection. The lowest concentration of SARS-CoV-2 viral copies this assay can detect is 250 copies / mL. A negative result does not preclude SARS-CoV-2 infection and should not be used as the sole basis for treatment or other patient management decisions.  A negative result may occur with improper specimen collection / handling, submission of specimen other than nasopharyngeal swab, presence of viral mutation(s) within the areas targeted by this assay, and inadequate number of viral copies (<250 copies / mL). A negative result must be combined with clinical observations, patient history, and epidemiological information.  Fact Sheet for Patients:   StrictlyIdeas.no  Fact Sheet for Healthcare Providers: BankingDealers.co.za  This test is not yet approved or  cleared by the [place] FDA and has been authorized for detection and/or diagnosis of SARS-CoV-2 by FDA under an Emergency Use Authorization (EUA).  This EUA will remain in effect (meaning this test can be used) for the duration of the COVID-19 declaration under Section 564(b)(1) of the Act, 21 U.S.C. section 360bbb-3(b)(1), unless the authorization is terminated or revoked sooner.  Performed at [facility], [address] [place], [place] [address]       Studies:  DG Chest 2 View  Result Date: 08/10/2020 CLINICAL DATA:  Chest pain EXAM: CHEST - 2 VIEW COMPARISON:  12/20/2018 FINDINGS: Normal heart size and mediastinal contours. Coronary stenting. No acute infiltrate or edema. No effusion or pneumothorax. No acute osseous findings. IMPRESSION: No evidence of active disease.  Electronically Signed   By: Monte Fantasia M.D.   On: 08/10/2020 14:19       [place]   [facility] If 7PM-7AM, please contact night-coverage at [email or web address], Office  [phone]   08/11/2020, 8:00 AM  LOS: 1 day

## 2020-08-12 ENCOUNTER — Encounter: Payer: Self-pay | Admitting: Cardiology

## 2020-08-12 LAB — BASIC METABOLIC PANEL
Anion gap: 10 (ref 5–15)
BUN: 17 mg/dL (ref 6–20)
CO2: 21 mmol/L — ABNORMAL LOW (ref 22–32)
Calcium: 8.3 mg/dL — ABNORMAL LOW (ref 8.9–10.3)
Chloride: 107 mmol/L (ref 98–111)
Creatinine, Ser: 1.06 mg/dL (ref 0.61–1.24)
GFR calc Af Amer: >60 mL/min (ref >60)
GFR calc non Af Amer: >60 mL/min (ref >60)
Glucose, Bld: 105 mg/dL — ABNORMAL HIGH (ref 70–99)
Potassium: 3.4 mmol/L — ABNORMAL LOW (ref 3.5–5.1)
Sodium: 138 mmol/L (ref 135–145)

## 2020-08-12 LAB — CBC
HCT: 36.9 % — ABNORMAL LOW (ref 39.0–52.0)
Hemoglobin: 13.3 g/dL (ref 13.0–17.0)
MCH: 34.5 pg — ABNORMAL HIGH (ref 26.0–34.0)
MCHC: 36 g/dL (ref 30.0–36.0)
MCV: 95.8 fL (ref 80.0–100.0)
Platelets: 177 10*3/uL (ref 150–400)
RBC: 3.85 MIL/uL — ABNORMAL LOW (ref 4.22–5.81)
RDW: 12.3 % (ref 11.5–15.5)
WBC: 8.7 10*3/uL (ref 4.0–10.5)
nRBC: 0 % (ref 0.0–0.2)

## 2020-08-12 MED ORDER — ATORVASTATIN CALCIUM 40 MG PO TABS: 40.0000 mg | ORAL_TABLET | Freq: Every day | ORAL | 2 refills | Status: AC

## 2020-08-12 MED ORDER — LISINOPRIL 5 MG PO TABS: 5.0000 mg | ORAL_TABLET | Freq: Every day | ORAL | 3 refills | Status: AC

## 2020-08-12 MED ORDER — CLOPIDOGREL BISULFATE 75 MG PO TABS: 75.0000 mg | ORAL_TABLET | Freq: Every day | ORAL | 11 refills | Status: AC

## 2020-08-12 MED ORDER — LISINOPRIL 5 MG PO TABS
5.0000 mg | ORAL_TABLET | Freq: Every day | ORAL | Status: DC
  Administered 2020-08-12: 5 mg via ORAL
  Filled 2020-08-12: qty 1

## 2020-08-12 MED ORDER — ASPIRIN 81 MG PO TBEC: 81.0000 mg | DELAYED_RELEASE_TABLET | Freq: Every day | ORAL | 11 refills | Status: AC

## 2020-08-12 MED ORDER — POTASSIUM CHLORIDE CRYS ER 20 MEQ PO TBCR
20.0000 meq | EXTENDED_RELEASE_TABLET | Freq: Once | ORAL | Status: AC
  Administered 2020-08-12: 20 meq via ORAL
  Filled 2020-08-12: qty 1

## 2020-08-12 MED ORDER — NITROGLYCERIN 0.4 MG SL SUBL: 0.4000 mg | SUBLINGUAL_TABLET | SUBLINGUAL | 12 refills | Status: AC | PRN

## 2020-08-12 MED ORDER — METOPROLOL SUCCINATE ER 50 MG PO TB24
50.0000 mg | ORAL_TABLET | Freq: Every day | ORAL | Status: DC
  Administered 2020-08-12: 50 mg via ORAL
  Filled 2020-08-12: qty 1

## 2020-08-12 MED ORDER — METOPROLOL SUCCINATE ER 50 MG PO TB24: 50.0000 mg | ORAL_TABLET | Freq: Every day | ORAL | 3 refills | Status: AC

## 2020-08-12 NOTE — Progress Notes (Signed)
Mobility Specialist - Progress Note   08/12/20 1250  Mobility  Activity Ambulated in room;Ambulated in hall  Level of Assistance Independent  Assistive Device None  Distance Ambulated (ft) 260 ft  Mobility Response Tolerated well  Mobility performed by Mobility specialist  $Mobility charge 1 Mobility    Pre-mobility: 69 HR, 102/70 BP, 99% SpO2 Post-mobility: 74 HR, 110/73 BP, 99% SpO2   Pt sitting EOB w/ NT and wife present in room. Pt very agreeable to mobility session. Pt independent t/o session. Pt ambulated 260' w/ no assist. Pt had no complaints of pain or SOB. Overall, pt tolerated session very well. Pt left sitting EOB w/ wife present in room.   Jeniffer Culliver Mobility Specialist  08/12/20, 12:53 PM

## 2020-08-12 NOTE — Progress Notes (Signed)
Discharge instructions reviewed with pt. Opportunity for questions made available. Pt verbalized he wants to make his own follow up appointments. IV & telemetry removed per protocol for d/c home. Spouse at bedside to transport pt home.

## 2020-08-12 NOTE — Progress Notes (Addendum)
[facility] Cardiology  SUBJECTIVE: Patient sitting on side of the bed, feels great, denies chest pain   Vitals:   08/11/20 1703 08/11/20 1727 08/11/20 2052 08/12/20 0503  BP: 114/84 105/80 102/74 97/69  Pulse:  78 82 77  Resp:  17 18 18   Temp:  98.2 F (36.8 C) 98.2 F (36.8 C) 99.6 F (37.6 C)  TempSrc:  Oral Oral Oral  SpO2:  95% 98% 96%  Weight:  78.7 kg    Height:  5\' 6"  (1.676 m)       Intake/Output Summary (Last 24 hours) at 08/12/2020 5366 Last data filed at 08/12/2020 0300 Gross per 24 hour  Intake 680.71 ml  Output 425 ml  Net 255.71 ml      PHYSICAL EXAM  General: Well developed, well nourished, in no acute distress HEENT:  Normocephalic and atramatic Neck:  No JVD.  Lungs: Clear bilaterally to auscultation and percussion. Heart: HRRR . Normal S1 and S2 without gallops or murmurs.  Abdomen: Bowel sounds are positive, abdomen soft and non-tender  Msk:  Back normal, normal gait. Normal strength and tone for age. Extremities: No clubbing, cyanosis or edema.   Neuro: Alert and oriented X 3. Psych:  Good affect, responds appropriately   LABS: Basic Metabolic Panel: Recent Labs    08/11/20 0331 08/12/20 0511  NA 141 138  K 3.9 3.4*  CL 107 107  CO2 23 21*  GLUCOSE 100* 105*  BUN 17 17  CREATININE 1.02 1.06  CALCIUM 8.1* 8.3*   Liver Function Tests: No results for input(s): AST, ALT, ALKPHOS, BILITOT, PROT, ALBUMIN in the last 72 hours. No results for input(s): LIPASE, AMYLASE in the last 72 hours. CBC: Recent Labs    08/11/20 0331 08/12/20 0511  WBC 8.1 8.7  HGB 14.1 13.3  HCT 39.3 36.9*  MCV 96.1 95.8  PLT 174 177   Cardiac Enzymes: No results for input(s): CKTOTAL, CKMB, CKMBINDEX, TROPONINI in the last 72 hours. BNP: Invalid input(s): POCBNP D-Dimer: No results for input(s): DDIMER in the last 72 hours. Hemoglobin A1C: No results for input(s): HGBA1C in the last 72 hours. Fasting Lipid Panel: Recent Labs    08/11/20 0331  CHOL 253*   HDL 33*  LDLCALC 201*  TRIG 95  CHOLHDL 7.7   Thyroid Function Tests: No results for input(s): TSH, T4TOTAL, T3FREE, THYROIDAB in the last 72 hours.  Invalid input(s): FREET3 Anemia Panel: No results for input(s): VITAMINB12, FOLATE, FERRITIN, TIBC, IRON, RETICCTPCT in the last 72 hours.  DG Chest 2 View  Result Date: 08/10/2020 CLINICAL DATA:  Chest pain EXAM: CHEST - 2 VIEW COMPARISON:  12/20/2018 FINDINGS: Normal heart size and mediastinal contours. Coronary stenting. No acute infiltrate or edema. No effusion or pneumothorax. No acute osseous findings. IMPRESSION: No evidence of active disease. Electronically Signed   By: Monte Fantasia M.D.   On: 08/10/2020 14:19   CARDIAC CATHETERIZATION  Result Date: 08/11/2020  2nd Mrg lesion is 30% stenosed.  Ost LAD to Prox LAD lesion is 100% stenosed.  Previously placed Prox RCA to Mid RCA stent (unknown type) is widely patent.  Previously placed Dist RCA stent (unknown type) is widely patent.  RPDA lesion is 40% stenosed.  RPAV lesion is 50% stenosed.  Prox LAD to Mid LAD lesion is 100% stenosed.  A stent was successfully placed.  Post intervention, there is a 0% residual stenosis.  A stent was successfully placed.  Post intervention, there is a 0% residual stenosis.  1.  Non-ST elevation  myocardial infarction 2.  One-vessel coronary artery disease with occluded proximal LAD 3.  Ischemic cardiomyopathy with anteroapical akinesis 4.  Successful PCI with overlapping DES  (3.0 x 30 mm and 3.0 x 15 mm Resolute Onyx stents ) mid and proximal LAD Recommendations 1.  Dual antiplatelet therapy uninterrupted for 1 year 2.  Aggrastat infusion overnight     Echo   TELEMETRY: Sinus rhythm at 90 bpm:  ASSESSMENT AND PLAN:  Principal Problem:   NSTEMI (non-ST elevated myocardial infarction) ([place]) Active Problems:   CAD (coronary artery disease)   Essential hypertension, benign    1.  Non-ST elevation myocardial infarction, cardiac  catheterization revealed acute occlusion proximal LAD, underwent successful PCI with overlapping drug-eluting stents proximal and mid LAD, without complication, doing well, without recurrent chest pain 2.  Ischemic cardiomyopathy, LVEF 35 to 40%  Recommendations  1.  Agree with overall current therapy 2.  Dual antiplatelet therapy uninterrupted for 1 year 3.  Change metoprolol to tartrate to metoprolol succinate 50 mg daily 4.  Add lisinopril 5 mg daily 5.  Ambulate today 6.  If patient does well, discharge home later this afternoon 7.  Follow-up with Dr. Nehemiah Massed in 1 week   Isaias Cowman, MD, PhD, [facility] 08/12/2020 8:26 AM

## 2020-08-12 NOTE — Discharge Instructions (Signed)
Follow-up PCP for work-up for hematuria as outpatient.

## 2020-08-12 NOTE — Discharge Summary (Signed)
Physician Discharge Summary  Burwell Bethel Kam YPP:[id number] DOB: Dec 21, 1960 DOA: 08/10/2020  PCP: Guadalupe Maple, MD  Admit date: 08/10/2020 Discharge date: 08/12/2020  Time spent: 50* minutes  Recommendations for Outpatient Follow-up:  1. Follow-up cardiology in 1 week 2. Outpatient work-up for hematuria.   Discharge Diagnoses:  Principal Problem:   NSTEMI (non-ST elevated myocardial infarction) [facility]) Active Problems:   CAD (coronary artery disease)   Essential hypertension, benign   Discharge Condition: Stable  Diet recommendation: Heart healthy diet  Filed Weights   08/11/20 1243 08/11/20 1727  Weight: 79.4 kg 78.7 kg    History of present illness:  59 year old male with a history of CAD, hypertension, cardiac stents x3 followed by cardiology Dr. Nehemiah Massed complaint of intermittent central chest pain with radiation to jaw for the past 2 weeks.  Patient says that he ran out of Plavix 3 months ago.  His last stent was placed in January 2020. Patient was found to have NSTEMI, started on heparin infusion, metoprolol, aspirin, atorvastatin.  Cardiology was consulted.  Hospital Course:   1. NSTEMI-patient presented with NSTEMI, troponin went from 80- 850- 3150-5570-13,719 .  EKG showed T wave inversions in leads V4, V5, V6 and lead I consistent with lateral wall ischemia/infarction.  Patient was started on on IV heparin, aspirin, atorvastatin, metoprolol.:  Cardiology was consulted, patient underwent cardiac catheterization which showed acute occlusion of proximal LAD underwent successful PCI with overlapping drug-eluting stent proximal and mid LAD without complication.  Patient doing well without chest pain.  Will be discharged on dual antiplatelet therapy for 1 year including aspirin and Plavix.  Metoprolol succinate 50 mg daily, lisinopril 5 mg daily, atorvastatin 40 daily as well. 2. CAD s/p stent placement-patient has history of CAD with 3 stents placed.  He had 2 stents  placed in mid and distal RCA last year.  Continue metoprolol, aspirin, atorvastatin.  3. Hypertension-continue metoprolol 4. Hematuria-patient has significant hematuria with UA showing greater than 50 RBCs per high-power field.  Patient's flank pain has resolved.  Hematuria resolved.  Will follow up with PCP as outpatient for further evaluation.  Procedures: Cardiac catheterization  Consultations:    Discharge Exam: Vitals:   08/12/20 0845 08/12/20 1237  BP: 103/68 100/69  Pulse: 71 67  Resp: 19 18  Temp: 98.6 F (37 C)   SpO2: 98% 98%    General: Appears in no acute distress Cardiovascular: S1-S2, regular Respiratory: Clear to auscultation bilaterally  Discharge Instructions   Discharge Instructions    AMB Referral to Cardiac Rehabilitation - Phase II   Complete by: As directed    Diagnosis:  NSTEMI Coronary Stents     After initial evaluation and assessments completed: Virtual Based Care may be provided alone or in conjunction with Phase 2 Cardiac Rehab based on patient barriers.: Yes   Diet - low sodium heart healthy   Complete by: As directed    Increase activity slowly   Complete by: As directed      Allergies as of 08/12/2020      Reactions   Atorvastatin Other (See Comments)   "makes me feel bad"      Medication List    STOP taking these medications   aspirin 81 MG chewable tablet Replaced by: aspirin 81 MG EC tablet     TAKE these medications   aspirin 81 MG EC tablet Take 1 tablet (81 mg total) by mouth daily. Swallow whole. Start taking on: August 13, 2020 Replaces: aspirin 81 MG chewable tablet  atorvastatin 40 MG tablet Commonly known as: LIPITOR Take 1 tablet (40 mg total) by mouth daily. Start taking on: August 13, 2020   clopidogrel 75 MG tablet Commonly known as: PLAVIX Take 1 tablet (75 mg total) by mouth daily with breakfast. Start taking on: August 13, 2020   lisinopril 5 MG tablet Commonly known as: ZESTRIL Take 1 tablet (5  mg total) by mouth daily. Start taking on: August 13, 2020   metoprolol succinate 50 MG 24 hr tablet Commonly known as: TOPROL-XL Take 1 tablet (50 mg total) by mouth daily. Take with or immediately following a meal. Start taking on: August 13, 2020   multivitamin tablet Take 1 tablet by mouth daily.   nitroGLYCERIN 0.4 MG SL tablet Commonly known as: NITROSTAT Place 1 tablet (0.4 mg total) under the tongue every 5 (five) minutes x 3 doses as needed for chest pain.      Allergies  Allergen Reactions  . Atorvastatin Other (See Comments)    "makes me feel bad"    Follow-up Information    Corey Skains, MD Follow up in 1 week(s).   Specialty: Cardiology Contact information: [facility] [place] [address] [phone]        Guadalupe Maple, MD Follow up in 2 week(s).   Specialty: Family Medicine Contact information: [address] [place] [address] [phone]                The results of significant diagnostics from this hospitalization (including imaging, microbiology, ancillary and laboratory) are listed below for reference.    Significant Diagnostic Studies: DG Chest 2 View  Result Date: 08/10/2020 CLINICAL DATA:  Chest pain EXAM: CHEST - 2 VIEW COMPARISON:  12/20/2018 FINDINGS: Normal heart size and mediastinal contours. Coronary stenting. No acute infiltrate or edema. No effusion or pneumothorax. No acute osseous findings. IMPRESSION: No evidence of active disease. Electronically Signed   By: Monte Fantasia M.D.   On: 08/10/2020 14:19   CARDIAC CATHETERIZATION  Result Date: 08/11/2020  2nd Mrg lesion is 30% stenosed.  Ost LAD to Prox LAD lesion is 100% stenosed.  Previously placed Prox RCA to Mid RCA stent (unknown type) is widely patent.  Previously placed Dist RCA stent (unknown type) is widely patent.  RPDA lesion is 40% stenosed.  RPAV lesion is 50% stenosed.  Prox LAD to Mid LAD lesion is  100% stenosed.  A stent was successfully placed.  Post intervention, there is a 0% residual stenosis.  A stent was successfully placed.  Post intervention, there is a 0% residual stenosis.  1.  Non-ST elevation myocardial infarction 2.  One-vessel coronary artery disease with occluded proximal LAD 3.  Ischemic cardiomyopathy with anteroapical akinesis 4.  Successful PCI with overlapping DES  (3.0 x 30 mm and 3.0 x 15 mm Resolute Onyx stents ) mid and proximal LAD Recommendations 1.  Dual antiplatelet therapy uninterrupted for 1 year 2.  Aggrastat infusion overnight    Microbiology: Recent Results (from the past 240 hour(s))  SARS Coronavirus 2 by RT PCR (hospital order, performed in [facility] hospital lab) Nasopharyngeal Nasopharyngeal Swab     Status: None   Collection Time: 08/10/20  7:26 PM   Specimen: Nasopharyngeal Swab  Result Value Ref Range Status   SARS Coronavirus 2 NEGATIVE NEGATIVE Final    Comment: (NOTE) SARS-CoV-2 target nucleic acids are NOT DETECTED.  The SARS-CoV-2 RNA is generally detectable in upper and lower respiratory specimens during the acute  phase of infection. The lowest concentration of SARS-CoV-2 viral copies this assay can detect is 250 copies / mL. A negative result does not preclude SARS-CoV-2 infection and should not be used as the sole basis for treatment or other patient management decisions.  A negative result may occur with improper specimen collection / handling, submission of specimen other than nasopharyngeal swab, presence of viral mutation(s) within the areas targeted by this assay, and inadequate number of viral copies (<250 copies / mL). A negative result must be combined with clinical observations, patient history, and epidemiological information.  Fact Sheet for Patients:   StrictlyIdeas.no  Fact Sheet for Healthcare Providers: BankingDealers.co.za  This test is not yet approved or   cleared by the [place] FDA and has been authorized for detection and/or diagnosis of SARS-CoV-2 by FDA under an Emergency Use Authorization (EUA).  This EUA will remain in effect (meaning this test can be used) for the duration of the COVID-19 declaration under Section 564(b)(1) of the Act, 21 U.S.C. section 360bbb-3(b)(1), unless the authorization is terminated or revoked sooner.  Performed at [facility], [address] [place], [place] [address]      Labs: Basic Metabolic Panel: Recent Labs  Lab 08/10/20 1321 08/11/20 0331 08/12/20 0511  NA 139 141 138  K 3.9 3.9 3.4*  CL 109 107 107  CO2 19* 23 21*  GLUCOSE 96 100* 105*  BUN 19 17 17   CREATININE 0.95 1.02 1.06  CALCIUM 8.7* 8.1* 8.3*   Liver Function Tests: No results for input(s): AST, ALT, ALKPHOS, BILITOT, PROT, ALBUMIN in the last 168 hours. No results for input(s): LIPASE, AMYLASE in the last 168 hours. No results for input(s): AMMONIA in the last 168 hours. CBC: Recent Labs  Lab 08/10/20 1321 08/11/20 0331 08/12/20 0511  WBC 7.2 8.1 8.7  HGB 16.2 14.1 13.3  HCT 45.4 39.3 36.9*  MCV 95.8 96.1 95.8  PLT 200 174 177   C    Signed:  Oswald Hillock MD.  [facility] 08/12/2020, 1:37 PM

## 2020-08-20 DIAGNOSIS — I2510 Atherosclerotic heart disease of native coronary artery without angina pectoris: Secondary | ICD-10-CM | POA: Diagnosis not present

## 2020-08-20 DIAGNOSIS — I214 Non-ST elevation (NSTEMI) myocardial infarction: Secondary | ICD-10-CM | POA: Diagnosis not present

## 2020-08-20 DIAGNOSIS — E7801 Familial hypercholesterolemia: Secondary | ICD-10-CM | POA: Diagnosis not present

## 2020-08-20 DIAGNOSIS — I10 Essential (primary) hypertension: Secondary | ICD-10-CM | POA: Diagnosis not present

## 2020-09-19 DIAGNOSIS — I214 Non-ST elevation (NSTEMI) myocardial infarction: Secondary | ICD-10-CM | POA: Diagnosis not present

## 2020-09-19 DIAGNOSIS — I2510 Atherosclerotic heart disease of native coronary artery without angina pectoris: Secondary | ICD-10-CM | POA: Diagnosis not present

## 2020-11-19 DIAGNOSIS — I2510 Atherosclerotic heart disease of native coronary artery without angina pectoris: Secondary | ICD-10-CM | POA: Diagnosis not present

## 2020-11-19 DIAGNOSIS — Z79899 Other long term (current) drug therapy: Secondary | ICD-10-CM | POA: Diagnosis not present

## 2020-11-19 DIAGNOSIS — Z23 Encounter for immunization: Secondary | ICD-10-CM | POA: Diagnosis not present

## 2021-03-19 DIAGNOSIS — I214 Non-ST elevation (NSTEMI) myocardial infarction: Secondary | ICD-10-CM | POA: Diagnosis not present

## 2021-03-19 DIAGNOSIS — I2510 Atherosclerotic heart disease of native coronary artery without angina pectoris: Secondary | ICD-10-CM | POA: Diagnosis not present

## 2021-03-19 DIAGNOSIS — I10 Essential (primary) hypertension: Secondary | ICD-10-CM | POA: Diagnosis not present

## 2021-03-19 DIAGNOSIS — E7801 Familial hypercholesterolemia: Secondary | ICD-10-CM | POA: Diagnosis not present

## 2021-07-15 DIAGNOSIS — I214 Non-ST elevation (NSTEMI) myocardial infarction: Secondary | ICD-10-CM | POA: Diagnosis not present

## 2021-07-15 DIAGNOSIS — I2510 Atherosclerotic heart disease of native coronary artery without angina pectoris: Secondary | ICD-10-CM | POA: Diagnosis not present

## 2021-07-15 DIAGNOSIS — I10 Essential (primary) hypertension: Secondary | ICD-10-CM | POA: Diagnosis not present

## 2021-07-15 DIAGNOSIS — E785 Hyperlipidemia, unspecified: Secondary | ICD-10-CM | POA: Diagnosis not present

## 2021-08-28 DIAGNOSIS — D225 Melanocytic nevi of trunk: Secondary | ICD-10-CM | POA: Diagnosis not present

## 2021-08-28 DIAGNOSIS — I788 Other diseases of capillaries: Secondary | ICD-10-CM | POA: Diagnosis not present

## 2021-08-28 DIAGNOSIS — L570 Actinic keratosis: Secondary | ICD-10-CM | POA: Diagnosis not present

## 2021-08-28 DIAGNOSIS — D2272 Melanocytic nevi of left lower limb, including hip: Secondary | ICD-10-CM | POA: Diagnosis not present

## 2021-08-28 DIAGNOSIS — X32XXXA Exposure to sunlight, initial encounter: Secondary | ICD-10-CM | POA: Diagnosis not present

## 2021-08-28 DIAGNOSIS — L821 Other seborrheic keratosis: Secondary | ICD-10-CM | POA: Diagnosis not present

## 2021-11-27 DIAGNOSIS — Z09 Encounter for follow-up examination after completed treatment for conditions other than malignant neoplasm: Secondary | ICD-10-CM | POA: Diagnosis not present

## 2021-11-27 DIAGNOSIS — Z872 Personal history of diseases of the skin and subcutaneous tissue: Secondary | ICD-10-CM | POA: Diagnosis not present

## 2022-03-26 ENCOUNTER — Other Ambulatory Visit: Payer: Self-pay

## 2022-03-26 DIAGNOSIS — F17210 Nicotine dependence, cigarettes, uncomplicated: Secondary | ICD-10-CM

## 2022-03-26 DIAGNOSIS — Z87891 Personal history of nicotine dependence: Secondary | ICD-10-CM

## 2022-03-26 DIAGNOSIS — Z122 Encounter for screening for malignant neoplasm of respiratory organs: Secondary | ICD-10-CM

## 2022-04-12 ENCOUNTER — Ambulatory Visit (INDEPENDENT_AMBULATORY_CARE_PROVIDER_SITE_OTHER): Payer: BC Managed Care – PPO | Admitting: Acute Care

## 2022-04-12 ENCOUNTER — Encounter: Payer: Self-pay | Admitting: Acute Care

## 2022-04-12 DIAGNOSIS — F17210 Nicotine dependence, cigarettes, uncomplicated: Secondary | ICD-10-CM

## 2022-04-12 NOTE — Progress Notes (Signed)
Virtual Visit via Telephone Note ? ?I connected with Brad Cole on 11/03/21 at  2:00 PM EST by telephone and verified that I am speaking with the correct person using two identifiers. ? ?Location: ?Patient: Home ?Provider: Working from home ?  ?I discussed the limitations, risks, security and privacy concerns of performing an evaluation and management service by telephone and the availability of in person appointments. I also discussed with the patient that there may be a patient responsible charge related to this service. The patient expressed understanding and agreed to proceed. ? ?Shared Decision Making Visit Lung Cancer Screening Program ?([phone]) ? ? ?Eligibility: ?Age 61 y.o. ?Pack Years Smoking History Calculation 31.5 ?(# packs/per year x # years smoked) ?Recent History of coughing up blood  no ?Unexplained weight loss? no ?( >Than 15 pounds within the last 6 months ) ?Prior History Lung / other cancer no ?(Diagnosis within the last 5 years already requiring surveillance chest CT Scans). ?Smoking Status Current Smoker ?Former Smokers: Years since quit: NA ? Quit Date: NA ? ?Visit Components: ?Discussion included one or more decision making aids. yes ?Discussion included risk/benefits of screening. yes ?Discussion included potential follow up diagnostic testing for abnormal scans. yes ?Discussion included meaning and risk of over diagnosis. yes ?Discussion included meaning and risk of False Positives. yes ?Discussion included meaning of total radiation exposure. yes ? ?Counseling Included: ?Importance of adherence to annual lung cancer LDCT screening. yes ?Impact of comorbidities on ability to participate in the program. yes ?Ability and willingness to under diagnostic treatment. yes ? ?Smoking Cessation Counseling: ?Current Smokers:  ?Discussed importance of smoking cessation. yes ?Information about tobacco cessation classes and interventions provided to patient. yes ?Patient provided with "ticket"  for LDCT Scan. yes ?Symptomatic Patient. yes ? Counseling(Intermediate counseling: > three minutes) [id number] ?Diagnosis Code: Tobacco Use Z72.0 ?Asymptomatic Patient NA ? Counseling NA ?Former Smokers:  ?Discussed the importance of maintaining cigarette abstinence. yes ?Diagnosis Code: Personal History of Nicotine Dependence. W65.681 ?Information about tobacco cessation classes and interventions provided to patient. Yes ?Patient provided with "ticket" for LDCT Scan. yes ?Written Order for Lung Cancer Screening with LDCT placed in Epic. Yes ?(CT Chest Lung Cancer Screening Low Dose W/O CM) [id number] ?Z12.2-Screening of respiratory organs ?Z87.891-Personal history of nicotine dependence ? ? ?I spent 25 minutes of face to face time with him discussing the risks and benefits of lung cancer screening. We viewed a power point together that explained in detail the above noted topics. We took the time to pause the power point at intervals to allow for questions to be asked and answered to ensure understanding. We discussed that he had taken the single most powerful action possible to decrease his risk of developing lung cancer when he quit smoking. I counseled him to remain smoke free, and to contact me if he ever had the desire to smoke again so that I can provide resources and tools to help support the effort to remain smoke free. We discussed the time and location of the scan, and that either  Doroteo Glassman RN or I will call with the results within  24-48 hours of receiving them. He has my card and contact information in the event he needs to speak with me, in addition to a copy of the power point we reviewed as a resource. He verbalized understanding of all of the above and had no further questions upon leaving the office.  ? ? ? ?I explained to the patient that there has been a  high incidence of coronary artery disease noted on these exams. I explained that this is a non-gated exam therefore degree or severity cannot be  determined. This patient is on statin therapy. I have asked the patient to follow-up with their PCP regarding any incidental finding of coronary artery disease and management with diet or medication as they feel is clinically indicated. The patient verbalized understanding of the above and had no further questions. ? ? ?I spent 3 minutes counseling on smoking cessation and the health risks of continued tobacco abuse  ? ? ?Stephan Nelis D. Harris, NP-C ?[facility] ?Personal contact information can be found on Amion  ?04/12/2022, 8:42 AM ? ? ? ? ? ? ? ? ? ?

## 2022-04-12 NOTE — Patient Instructions (Signed)
Thank you for participating in the [facility] Screening [facility]. It was our pleasure to meet you today. We will call you with the results of your scan within the next few days. Your scan will be assigned a Lung RADS category score by the physicians reading the scans.  This Lung RADS score determines follow up scanning.  See below for description of categories, and follow up screening recommendations. We will be in touch to schedule your follow up screening annually or based on recommendations of our providers. We will fax a copy of your scan results to your Primary Care Physician, or the physician who referred you to the program, to ensure they have the results. Please call the office if you have any questions or concerns regarding your scanning experience or results.  Our office number is [phone]. Please speak with Denise Phelps, RN. , or  Denise Buckner RN, They are  our Lung Cancer Screening RN.'s If They are unavailable when you call, Please leave a message on the voice mail. We will return your call at our earliest convenience.This voice mail is monitored several times a day.  Remember, if your scan is normal, we will scan you annually as long as you continue to meet the criteria for the program. (Age 55-77, Current smoker or smoker who has quit within the last 15 years). If you are a smoker, remember, quitting is the single most powerful action that you can take to decrease your risk of lung cancer and other pulmonary, breathing related problems. We know quitting is hard, and we are here to help.  Please let us know if there is anything we can do to help you meet your goal of quitting. If you are a former smoker, congratulations. We are proud of you! Remain smoke free! Remember you can refer friends or family members through the number above.  We will screen them to make sure they meet criteria for the program. Thank you for helping us take better care of you by  participating in Lung Screening.  You can receive free nicotine replacement therapy ( patches, gum or mints) by calling 1-800-QUIT NOW. Please call so we can get you on the path to becoming  a non-smoker. I know it is hard, but you can do this!  Lung RADS Categories:  Lung RADS 1: no nodules or definitely non-concerning nodules.  Recommendation is for a repeat annual scan in 12 months.  Lung RADS 2:  nodules that are non-concerning in appearance and behavior with a very low likelihood of becoming an active cancer. Recommendation is for a repeat annual scan in 12 months.  Lung RADS 3: nodules that are probably non-concerning , includes nodules with a low likelihood of becoming an active cancer.  Recommendation is for a 6-month repeat screening scan. Often noted after an upper respiratory illness. We will be in touch to make sure you have no questions, and to schedule your 6-month scan.  Lung RADS 4 A: nodules with concerning findings, recommendation is most often for a follow up scan in 3 months or additional testing based on our provider's assessment of the scan. We will be in touch to make sure you have no questions and to schedule the recommended 3 month follow up scan.  Lung RADS 4 B:  indicates findings that are concerning. We will be in touch with you to schedule additional diagnostic testing based on our provider's  assessment of the scan.  Other options for assistance in smoking cessation (   As covered by your insurance benefits)  Hypnosis for smoking cessation  [employer] [phone]  Acupuncture for smoking cessation  [facility] [phone]   

## 2022-04-13 ENCOUNTER — Ambulatory Visit
Admission: RE | Admit: 2022-04-13 | Discharge: 2022-04-13 | Disposition: A | Discharge status: Home / Self Care | Payer: BC Managed Care – PPO | Source: Ambulatory Visit | Attending: Acute Care | Admitting: Acute Care

## 2022-04-13 DIAGNOSIS — Z122 Encounter for screening for malignant neoplasm of respiratory organs: Secondary | ICD-10-CM | POA: Diagnosis present

## 2022-04-13 DIAGNOSIS — F17210 Nicotine dependence, cigarettes, uncomplicated: Secondary | ICD-10-CM | POA: Insufficient documentation

## 2022-04-13 DIAGNOSIS — Z87891 Personal history of nicotine dependence: Secondary | ICD-10-CM | POA: Insufficient documentation

## 2022-04-14 ENCOUNTER — Other Ambulatory Visit: Payer: Self-pay

## 2022-04-14 DIAGNOSIS — Z87891 Personal history of nicotine dependence: Secondary | ICD-10-CM

## 2022-04-14 DIAGNOSIS — Z122 Encounter for screening for malignant neoplasm of respiratory organs: Secondary | ICD-10-CM

## 2022-04-14 DIAGNOSIS — F17210 Nicotine dependence, cigarettes, uncomplicated: Secondary | ICD-10-CM

## 2022-07-28 ENCOUNTER — Other Ambulatory Visit: Payer: Self-pay | Admitting: Physical Medicine & Rehabilitation

## 2022-07-28 DIAGNOSIS — M5442 Lumbago with sciatica, left side: Secondary | ICD-10-CM

## 2022-08-02 ENCOUNTER — Ambulatory Visit: Admission: RE | Admit: 2022-08-02 | Payer: BC Managed Care – PPO | Source: Ambulatory Visit

## 2022-12-14 IMAGING — CT CT CHEST LUNG CANCER SCREENING LOW DOSE W/O CM
2 of 5 series · 15 of 40 positions shown, 18 images · non-contrast
Comparison: Chest radiograph 08/10/2020. No prior CT.

CLINICAL DATA: 32 pack-year smoking history/current smoker



[Series 3: lung 1.00 · axial · 0.71mm/px · z∈[-1193,-867]mm · 12 of 360 slices shown, 15 images]
[im 17/360  mediastinal]
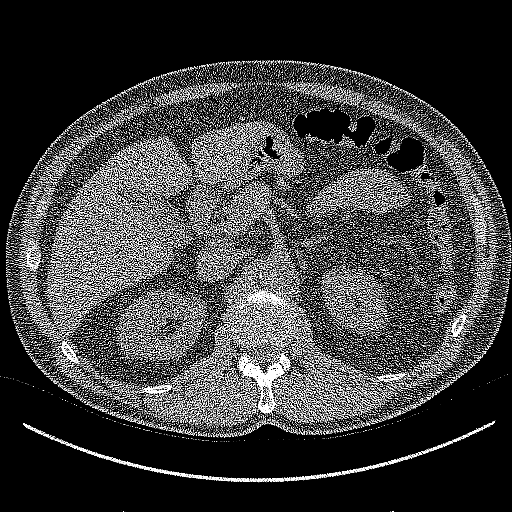
[im 17/360  lung]
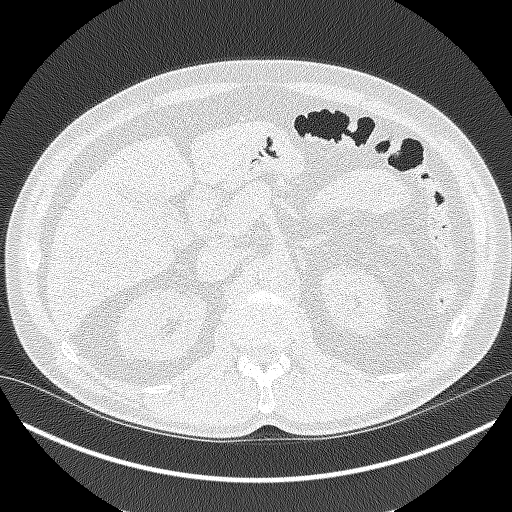
[im 49/360  lung]
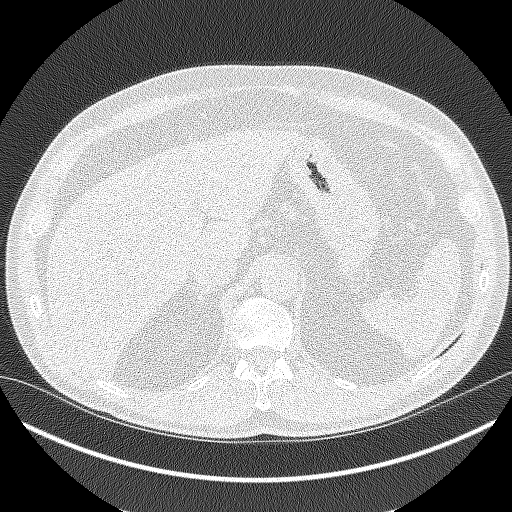
[im 82/360  lung]
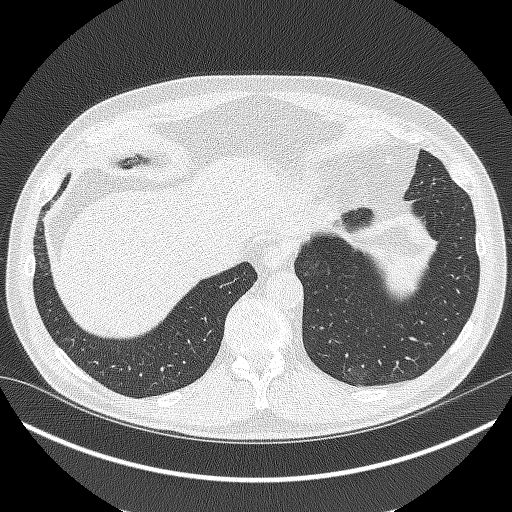
[im 115/360  lung]
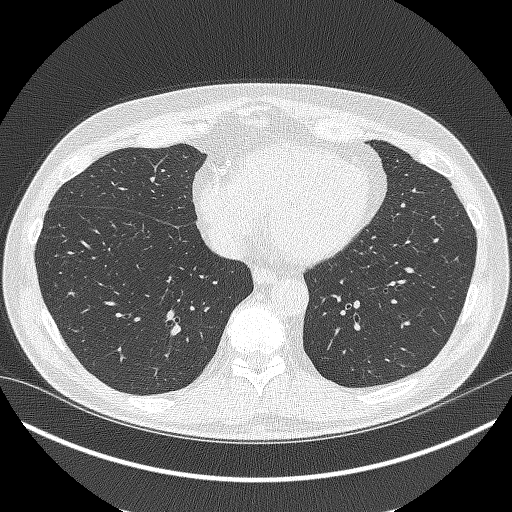
[im 131/360  mediastinal]
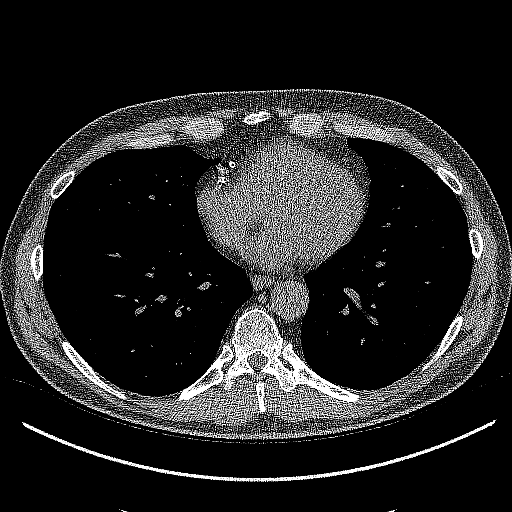
[im 131/360  lung]
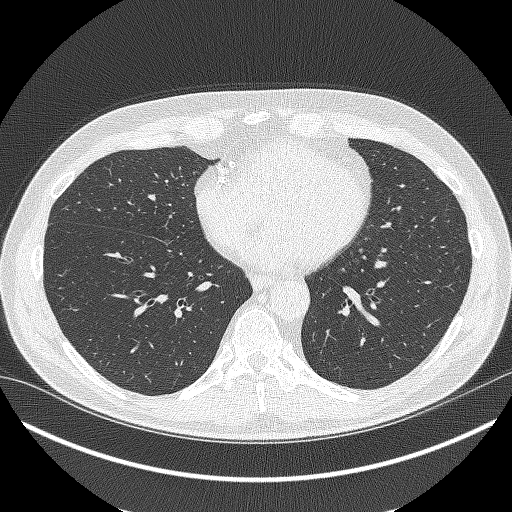
[im 164/360  lung]
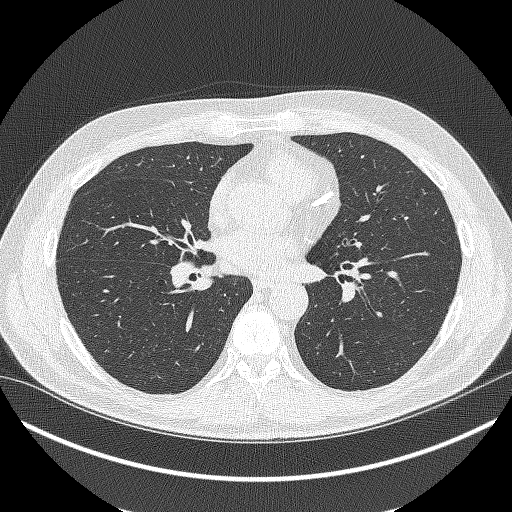
[im 196/360  lung]
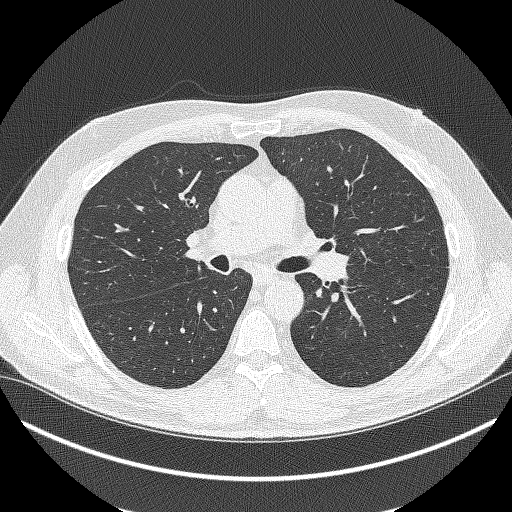
[im 229/360  lung]
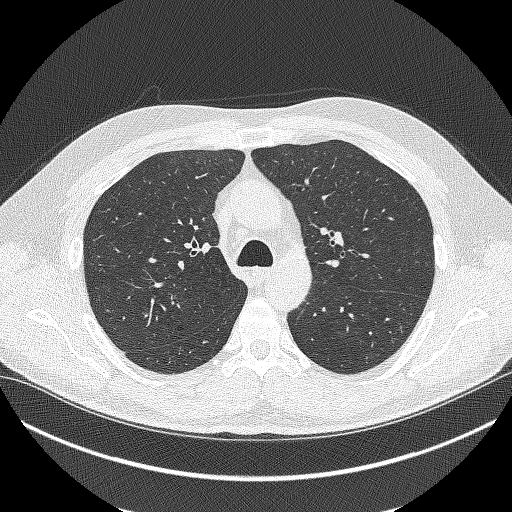
[im 245/360  mediastinal]
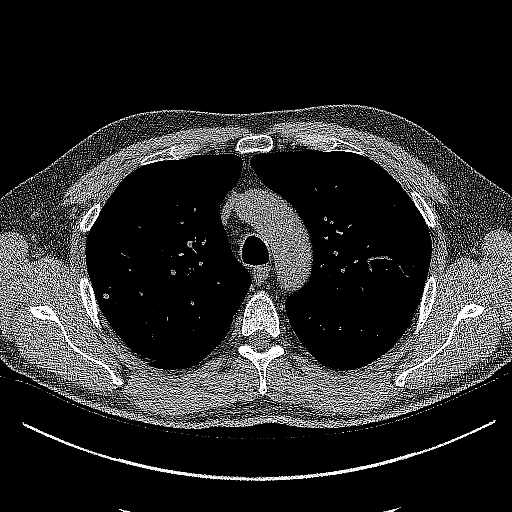
[im 245/360  lung]
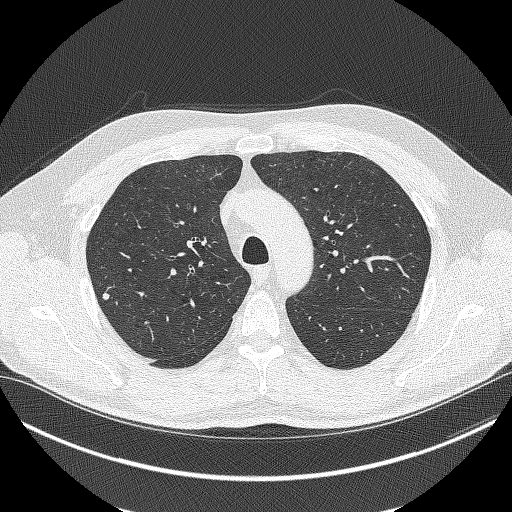
[im 278/360  lung]
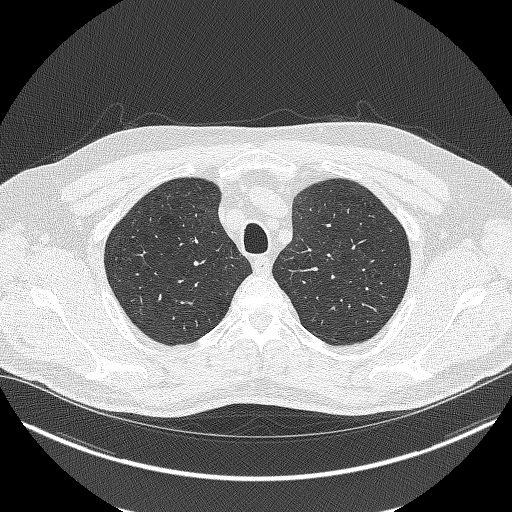
[im 311/360  lung]
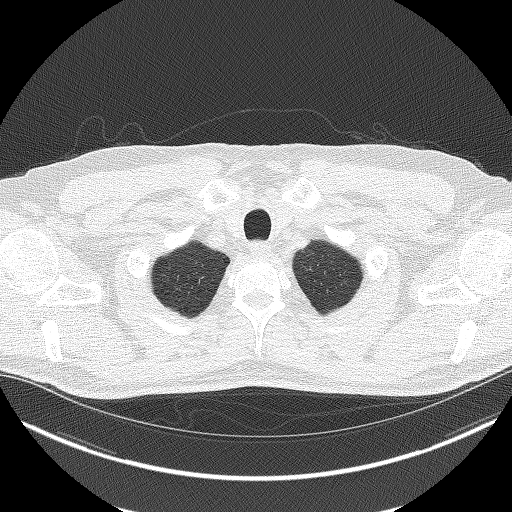
[im 343/360  lung]
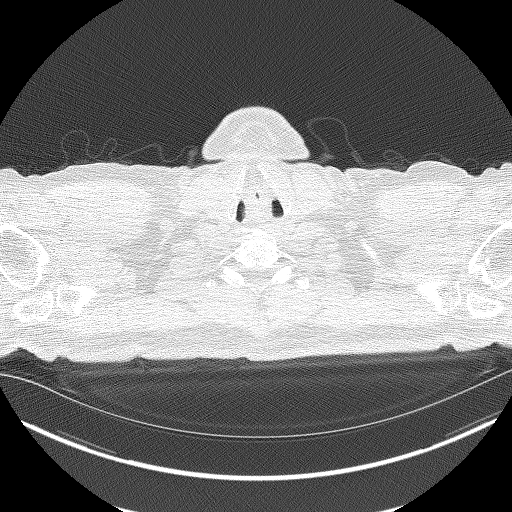

[Series 5: coronals lung 1.00 cor · coronal · 0.70mm/px · 3 of 366 slices shown]
[im 74/366  lung]
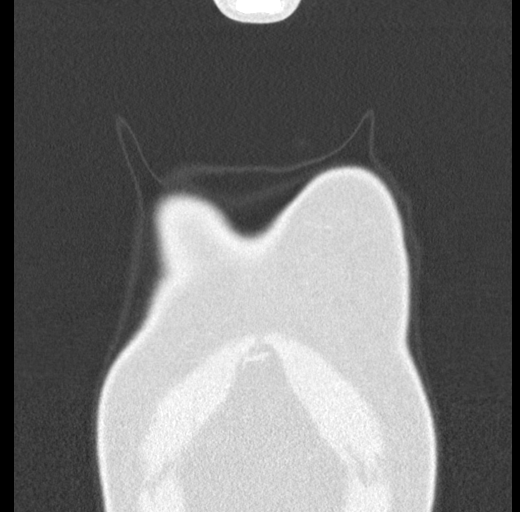
[im 147/366  lung]
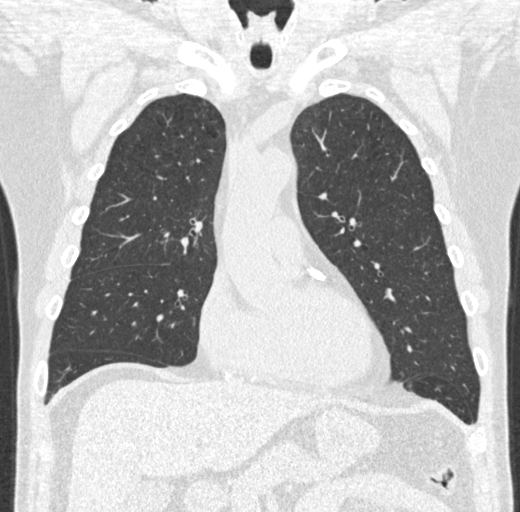
[im 220/366  lung]
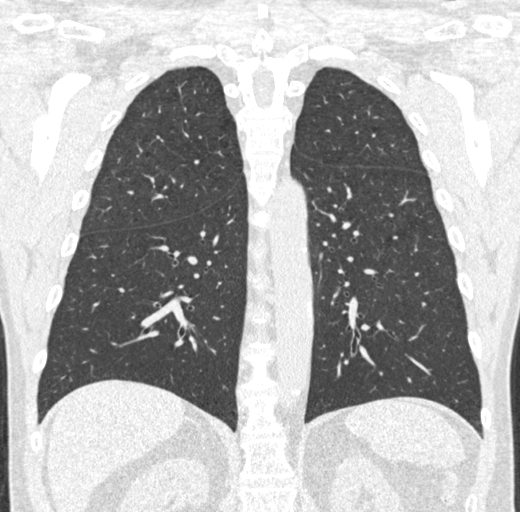

[15 of 40 positions shown; findings below may reference images not displayed]

FINDINGS: Cardiovascular: Aortic atherosclerosis. Normal heart size, without
pericardial effusion. Lad coronary artery stent. Right coronary
artery calcification versus a stent.

Mediastinum/Nodes: No mediastinal or definite hilar adenopathy,
given limitations of unenhanced CT.

Lungs/Pleura: No pleural fluid. Mild centrilobular emphysema.
Presumed secretions along the left side of the trachea.

Pulmonary nodules of maximally volume derived equivalent diameter
5.3 mm.

Upper Abdomen: Normal imaged portions of the liver, spleen, stomach,
pancreas, gallbladder, adrenal glands, kidneys.

Musculoskeletal: No acute osseous abnormality.
IMPRESSION: Lung-RADS 2, benign appearance or behavior. Continue annual
screening with low-dose chest CT without contrast in 12 months.

Aortic Atherosclerosis ([id number]-[id number]) and Emphysema ([id number]).

## 2023-04-15 ENCOUNTER — Ambulatory Visit: Admission: RE | Admit: 2023-04-15 | Payer: BC Managed Care – PPO | Source: Ambulatory Visit
